# Patient Record
Sex: Female | Born: 1940 | Race: White | Hispanic: No | Marital: Married | Smoking: Never smoker
Health system: Southern US, Community
[De-identification: ages and names within clinical notes are randomized; demographics above are authoritative.]

## PROBLEM LIST (undated history)

## (undated) DIAGNOSIS — L700 Acne vulgaris: Secondary | ICD-10-CM

## (undated) DIAGNOSIS — F40240 Claustrophobia: Secondary | ICD-10-CM

## (undated) DIAGNOSIS — E785 Hyperlipidemia, unspecified: Secondary | ICD-10-CM

## (undated) DIAGNOSIS — G2581 Restless legs syndrome: Secondary | ICD-10-CM

## (undated) DIAGNOSIS — K635 Polyp of colon: Secondary | ICD-10-CM

## (undated) DIAGNOSIS — E049 Nontoxic goiter, unspecified: Secondary | ICD-10-CM

## (undated) DIAGNOSIS — F988 Other specified behavioral and emotional disorders with onset usually occurring in childhood and adolescence: Secondary | ICD-10-CM

## (undated) DIAGNOSIS — K5790 Diverticulosis of intestine, part unspecified, without perforation or abscess without bleeding: Secondary | ICD-10-CM

## (undated) DIAGNOSIS — J309 Allergic rhinitis, unspecified: Secondary | ICD-10-CM

## (undated) DIAGNOSIS — F419 Anxiety disorder, unspecified: Secondary | ICD-10-CM

## (undated) DIAGNOSIS — N939 Abnormal uterine and vaginal bleeding, unspecified: Secondary | ICD-10-CM

## (undated) DIAGNOSIS — M722 Plantar fascial fibromatosis: Secondary | ICD-10-CM

## (undated) DIAGNOSIS — R42 Dizziness and giddiness: Secondary | ICD-10-CM

## (undated) DIAGNOSIS — E669 Obesity, unspecified: Secondary | ICD-10-CM

## (undated) DIAGNOSIS — E039 Hypothyroidism, unspecified: Secondary | ICD-10-CM

## (undated) DIAGNOSIS — F32A Depression, unspecified: Secondary | ICD-10-CM

## (undated) DIAGNOSIS — G20 Parkinson's disease: Secondary | ICD-10-CM

## (undated) DIAGNOSIS — R319 Hematuria, unspecified: Secondary | ICD-10-CM

## (undated) DIAGNOSIS — K589 Irritable bowel syndrome without diarrhea: Secondary | ICD-10-CM

## (undated) DIAGNOSIS — F329 Major depressive disorder, single episode, unspecified: Secondary | ICD-10-CM

## (undated) DIAGNOSIS — M152 Bouchard's nodes (with arthropathy): Secondary | ICD-10-CM

## (undated) DIAGNOSIS — G20A1 Parkinson's disease without dyskinesia, without mention of fluctuations: Secondary | ICD-10-CM

## (undated) HISTORY — DX: Plantar fascial fibromatosis: M72.2

## (undated) HISTORY — DX: Major depressive disorder, single episode, unspecified: F32.9

## (undated) HISTORY — DX: Obesity, unspecified: E66.9

## (undated) HISTORY — DX: Parkinson's disease without dyskinesia, without mention of fluctuations: G20.A1

## (undated) HISTORY — PX: SHOULDER SURGERY: SHX246

## (undated) HISTORY — DX: Bouchard's nodes (with arthropathy): M15.2

## (undated) HISTORY — DX: Acne vulgaris: L70.0

## (undated) HISTORY — DX: Hyperlipidemia, unspecified: E78.5

## (undated) HISTORY — DX: Other specified behavioral and emotional disorders with onset usually occurring in childhood and adolescence: F98.8

## (undated) HISTORY — DX: Nontoxic goiter, unspecified: E04.9

## (undated) HISTORY — DX: Abnormal uterine and vaginal bleeding, unspecified: N93.9

## (undated) HISTORY — DX: Irritable bowel syndrome, unspecified: K58.9

## (undated) HISTORY — DX: Parkinson's disease: G20

## (undated) HISTORY — DX: Restless legs syndrome: G25.81

## (undated) HISTORY — DX: Polyp of colon: K63.5

## (undated) HISTORY — DX: Diverticulosis of intestine, part unspecified, without perforation or abscess without bleeding: K57.90

## (undated) HISTORY — DX: Claustrophobia: F40.240

## (undated) HISTORY — DX: Dizziness and giddiness: R42

## (undated) HISTORY — DX: Anxiety disorder, unspecified: F41.9

## (undated) HISTORY — DX: Hypothyroidism, unspecified: E03.9

## (undated) HISTORY — PX: GLAUCOMA SURGERY: SHX656

## (undated) HISTORY — DX: Allergic rhinitis, unspecified: J30.9

## (undated) HISTORY — DX: Hematuria, unspecified: R31.9

## (undated) HISTORY — DX: Depression, unspecified: F32.A

---

## 1982-12-12 HISTORY — PX: SIMPLE MASTECTOMY: SHX2409

## 1992-12-12 DIAGNOSIS — R319 Hematuria, unspecified: Secondary | ICD-10-CM

## 1992-12-12 HISTORY — DX: Hematuria, unspecified: R31.9

## 1998-08-18 ENCOUNTER — Other Ambulatory Visit: Admission: RE | Admit: 1998-08-18 | Discharge: 1998-08-18 | Payer: Self-pay | Admitting: *Deleted

## 1999-09-15 ENCOUNTER — Other Ambulatory Visit: Admission: RE | Admit: 1999-09-15 | Discharge: 1999-09-15 | Payer: Self-pay | Admitting: *Deleted

## 2000-07-20 ENCOUNTER — Encounter: Payer: Self-pay | Admitting: Orthopedic Surgery

## 2000-07-20 ENCOUNTER — Encounter: Admission: RE | Admit: 2000-07-20 | Discharge: 2000-07-20 | Payer: Self-pay | Admitting: Orthopedic Surgery

## 2000-09-15 ENCOUNTER — Other Ambulatory Visit: Admission: RE | Admit: 2000-09-15 | Discharge: 2000-09-15 | Payer: Self-pay | Admitting: *Deleted

## 2000-12-21 ENCOUNTER — Other Ambulatory Visit: Admission: RE | Admit: 2000-12-21 | Discharge: 2000-12-21 | Payer: Self-pay | Admitting: *Deleted

## 2001-09-17 ENCOUNTER — Other Ambulatory Visit: Admission: RE | Admit: 2001-09-17 | Discharge: 2001-09-17 | Payer: Self-pay | Admitting: *Deleted

## 2002-09-09 ENCOUNTER — Other Ambulatory Visit: Admission: RE | Admit: 2002-09-09 | Discharge: 2002-09-09 | Payer: Self-pay | Admitting: *Deleted

## 2003-09-11 ENCOUNTER — Other Ambulatory Visit: Admission: RE | Admit: 2003-09-11 | Discharge: 2003-09-11 | Payer: Self-pay | Admitting: Obstetrics and Gynecology

## 2004-09-13 ENCOUNTER — Other Ambulatory Visit: Admission: RE | Admit: 2004-09-13 | Discharge: 2004-09-13 | Payer: Self-pay | Admitting: *Deleted

## 2006-09-18 ENCOUNTER — Other Ambulatory Visit: Admission: RE | Admit: 2006-09-18 | Discharge: 2006-09-18 | Payer: Self-pay | Admitting: Obstetrics & Gynecology

## 2007-03-06 ENCOUNTER — Ambulatory Visit: Payer: Self-pay | Admitting: Internal Medicine

## 2007-03-20 ENCOUNTER — Ambulatory Visit: Payer: Self-pay | Admitting: Internal Medicine

## 2007-05-13 HISTORY — PX: OTHER SURGICAL HISTORY: SHX169

## 2007-09-26 ENCOUNTER — Other Ambulatory Visit: Admission: RE | Admit: 2007-09-26 | Discharge: 2007-09-26 | Payer: Self-pay | Admitting: Obstetrics & Gynecology

## 2007-12-27 ENCOUNTER — Ambulatory Visit: Payer: Self-pay | Admitting: Internal Medicine

## 2007-12-27 LAB — CONVERTED CEMR LAB
ALT: 12 units/L (ref 0–35)
Albumin: 4 g/dL (ref 3.5–5.2)
Alkaline Phosphatase: 54 units/L (ref 39–117)
Creatinine, Ser: 0.7 mg/dL (ref 0.4–1.2)

## 2008-01-02 ENCOUNTER — Ambulatory Visit (HOSPITAL_COMMUNITY): Admission: RE | Admit: 2008-01-02 | Discharge: 2008-01-02 | Payer: Self-pay | Admitting: Internal Medicine

## 2008-10-06 ENCOUNTER — Other Ambulatory Visit: Admission: RE | Admit: 2008-10-06 | Discharge: 2008-10-06 | Payer: Self-pay | Admitting: Obstetrics & Gynecology

## 2008-10-30 ENCOUNTER — Encounter: Admission: RE | Admit: 2008-10-30 | Discharge: 2008-10-30 | Payer: Self-pay | Admitting: Sports Medicine

## 2008-11-25 ENCOUNTER — Encounter: Admission: RE | Admit: 2008-11-25 | Discharge: 2008-11-25 | Payer: Self-pay | Admitting: Sports Medicine

## 2008-12-10 ENCOUNTER — Encounter: Admission: RE | Admit: 2008-12-10 | Discharge: 2008-12-10 | Payer: Self-pay | Admitting: Sports Medicine

## 2009-01-16 ENCOUNTER — Encounter: Admission: RE | Admit: 2009-01-16 | Discharge: 2009-01-16 | Payer: Self-pay | Admitting: Rheumatology

## 2009-04-13 ENCOUNTER — Encounter: Admission: RE | Admit: 2009-04-13 | Discharge: 2009-04-13 | Payer: Self-pay | Admitting: Sports Medicine

## 2009-07-13 ENCOUNTER — Encounter: Admission: RE | Admit: 2009-07-13 | Discharge: 2009-07-13 | Payer: Self-pay | Admitting: Orthopedic Surgery

## 2011-01-02 ENCOUNTER — Encounter: Payer: Self-pay | Admitting: Neurological Surgery

## 2011-04-26 NOTE — Assessment & Plan Note (Signed)
[facility]                         GASTROENTEROLOGY OFFICE NOTE   MARVIA, TROOST                      MRN:          [id number]  DATE:12/27/2007                            DOB:          1941-04-26    Ms. Cassarino is a 70 year old white female who is here today for  evaluation of pain under her right breast, radiating laterally all the  way to the back which has been there for at least 6 months.  The date of  onset corresponds with a breast implant replacement in June 2008 by Dr.  Stephens November.  She has slightly larger implants this time than before.  The pain is worse during the day, as the day progresses, and usually is  relieved by laying down; it also gets worse after she eats.  She has  gained all together about 10 pounds in the last year.  The pain is not  associated with exercise, in fact she has a Systems analyst 4 times a  week and does weight lifting and aerobics without any influence from her  right-sided pain.  It is a burning pain and becomes quite severe at  times.  There is no rash overlying the pain.  Pain is not worse with  inspiration and there are no respiratory symptoms of shortness of breath  or cough.  She has never had this pain prior to the surgery.  We have  seen Ms. Shuler in the past for colorectal screening, she had a  colonoscopy in 1997 and again in 2003 and last time in April 2008.  She  has a history of hyperplastic polyp of the colon and first grade  hemorrhoids, these do not seem to be issues at this time.   MEDICATIONS:  1. Lexapro 20 mg p.o. daily.  2. Ambien 5 mg nightly.  3. Synthroid 50 mcg daily.  4. Vivelle patch 0.0375 mg b.i.d.  5. Prometrium 100 mg daily.  6. Aspirin 81 mg p.o. daily.  7. Fish oil.  8. Red yeast rice.  9. Vitamin C, B.  10.Calcium supplement.  11.Magnesium.  12.Stool softener.   PAST HISTORY:  1. Thyroid problems.  2. Anxiety.  3. Allergies.  4. Breast surgeries x2.   She  had initially subtotal mastectomy in 1982 at [facility] because of high  risk for breast cancer.  She had silicone implants initially and these  were replaced by new silicone implants in June 2008.   FAMILY HISTORY:  Positive for diabetes in mother, breast cancer in  mother, grandmothers and aunts.   SOCIAL HISTORY:  Married with no children.  She has Master's Degree in  Education, she is a Runner, broadcasting/film/video.  She does not smoke, drinks alcohol  socially.   REVIEW OF SYSTEMS:  Positive for allergies, sleeping problems.   PHYSICAL EXAMINATION:  Blood pressure 110/70, pulse 64, weight 174  pounds.  She was alert, oriented, in no distress.  NECK:  Supple, there was no tenderness.  No tenderness over the scapula.  There was some tenderness over the mid-  thoracic spine extending through the intercostal space all the way  around to  the right upper quadrant around the 10th or 11th ribs.  The  breast implants were well-healed and I could not elicit any tenderness  as far as the breast is concerned.  There is no lymphadenopathy in the  right axilla.  On inspiration her lungs were clear, there was no pain or  rub on inspiration.  ABDOMEN:  Soft and nontender with normoactive bowel sounds, no  tenderness on inspiration.  Liver edge was at costal margin.  SKIN:  Did not show any herpetic rash overlying the area of the pain.   IMPRESSION:  A 70 year old white female with new onset of right side  lateral chest pain which resembles a chest wall pain except that it is  not related to exercise.  One would think that it has some correlation  with her breast implants such as since the pain started after her  surgery and they are a larger size than before, but the pain radiates  all the way to the back, to the thoracic spine, which raises the  question either of shingles or possibly some sort of intercostal  neuralgia.  Patient is suspecting this could be her gallbladder as well,  although she really does not have  any clear cut gastrointestinal  symptoms and her exam of the right upper quadrant is negative.   PLAN:  1. CT scan of the chest to look for any pulmonary lesions, any chest      wall abnormalities or structure abnormalities of the right      diaphragm.  2. Upper abdominal ultrasound to look at the liver as well as      gallbladder.  3. Consider a trial of an intercostal nerve block to see whether it      would relieve her pain.  4. We are also going to check her liver function test today.     Hedwig Morton. Juanda Chance, MD  Electronically Signed    DMB/MedQ  DD: 12/27/2007  DT: 12/27/2007  Job #: [id number]   cc:   Consuello Bossier., M.D.

## 2011-12-22 ENCOUNTER — Other Ambulatory Visit: Payer: Self-pay | Admitting: Orthopedic Surgery

## 2011-12-22 DIAGNOSIS — M542 Cervicalgia: Secondary | ICD-10-CM

## 2011-12-25 ENCOUNTER — Ambulatory Visit
Admission: RE | Admit: 2011-12-25 | Discharge: 2011-12-25 | Disposition: A | Discharge status: Home / Self Care | Payer: BC Managed Care – PPO | Source: Ambulatory Visit | Attending: Orthopedic Surgery | Admitting: Orthopedic Surgery

## 2011-12-25 DIAGNOSIS — M542 Cervicalgia: Secondary | ICD-10-CM

## 2011-12-28 ENCOUNTER — Other Ambulatory Visit: Payer: Self-pay | Admitting: Orthopedic Surgery

## 2011-12-28 DIAGNOSIS — M79603 Pain in arm, unspecified: Secondary | ICD-10-CM

## 2011-12-28 DIAGNOSIS — M542 Cervicalgia: Secondary | ICD-10-CM

## 2011-12-29 ENCOUNTER — Ambulatory Visit
Admission: RE | Admit: 2011-12-29 | Discharge: 2011-12-29 | Disposition: A | Discharge status: Home / Self Care | Payer: Medicare Other | Source: Ambulatory Visit | Attending: Orthopedic Surgery | Admitting: Orthopedic Surgery

## 2011-12-29 DIAGNOSIS — M542 Cervicalgia: Secondary | ICD-10-CM

## 2011-12-29 DIAGNOSIS — M79603 Pain in arm, unspecified: Secondary | ICD-10-CM

## 2011-12-29 MED ORDER — TRIAMCINOLONE ACETONIDE 40 MG/ML IJ SUSP (RADIOLOGY)
60.0000 mg | Freq: Once | INTRAMUSCULAR | Status: AC
  Administered 2011-12-29: 60 mg via EPIDURAL

## 2011-12-29 MED ORDER — IOHEXOL 300 MG/ML  SOLN
1.0000 mL | Freq: Once | INTRAMUSCULAR | Status: AC | PRN
  Administered 2011-12-29: 1 mL via EPIDURAL

## 2012-05-03 ENCOUNTER — Encounter: Payer: Self-pay | Admitting: *Deleted

## 2012-05-03 ENCOUNTER — Telehealth: Payer: Self-pay | Admitting: Internal Medicine

## 2012-05-03 NOTE — Telephone Encounter (Signed)
Patient calling to report 4-5 bowel movements/day that are pudding like consistency for the last several months. Also has lots of gas. Denies pain, cramping, recent travel or antibiotic use. States it is now making her uncomfortable going out because she is afraid to get to far from a bathroom. She has tried Citrucel and Metamucil. She will add Align. Last colonoscopy 03/20/07- diverticulosis. Scheduled OV on 05/11/12 at 2:30 PM.

## 2012-05-04 MED ORDER — DICYCLOMINE HCL 20 MG PO TABS: 20.0000 mg | ORAL_TABLET | Freq: Three times a day (TID) | ORAL | Status: DC | PRN

## 2012-05-04 NOTE — Telephone Encounter (Signed)
Spoke with patient and gave her Dr. Brodie's recommendation. rx sent. 

## 2012-05-04 NOTE — Telephone Encounter (Signed)
Rx sent to pharmacy. Line busy unable to reach patient

## 2012-05-04 NOTE — Telephone Encounter (Signed)
May use bentyl 20 mg po tid till she sees me on 05/11/2012, #30

## 2012-05-11 ENCOUNTER — Ambulatory Visit (INDEPENDENT_AMBULATORY_CARE_PROVIDER_SITE_OTHER): Payer: BC Managed Care – PPO | Admitting: Internal Medicine

## 2012-05-11 ENCOUNTER — Encounter: Payer: Self-pay | Admitting: Internal Medicine

## 2012-05-11 VITALS — BP 104/72 | HR 68 | Ht 63.5 in | Wt 191.2 lb

## 2012-05-11 DIAGNOSIS — K589 Irritable bowel syndrome without diarrhea: Secondary | ICD-10-CM

## 2012-05-11 NOTE — Patient Instructions (Addendum)
We have given you samples of Align. This puts good bacteria back into your colon. You should take 1 capsule by mouth once daily. If this works well for you, it can be purchased over the counter. CC: Dr Kirby Funk Take bentyl 10 mg at  Bedtime to avoid the sleepiness associated with the medication

## 2012-05-11 NOTE — Progress Notes (Signed)
Ashley Henderson May 24, 1941 MRN [id number]    History of Present Illness:  This is a 71 year old white female with irritable bowel syndrome and a recent change in bowel habits  up to 4 soft bowl movements  times a day. She went to the beach recently and could not get away from the house because of frequent stools. She has had a stressful year. Her father died and her husband's son passed away . She denies any abdominal pain or rectal bleeding. She has had several colonoscopies in the past; the first one was in 1997, then in 2003, she had a hyperplastic polyp and in April 2008, no polyps were found. She was recently given probiotics which greatly improved her bowel habits to were she doesn't go as often. We sent her in some Bentyl 10 mg but it made her sleepy.   Past Medical History  Diagnosis Date  . Hemorrhoids   . Hyperplastic colon polyp   . Diverticulosis   . Anxiety   . Depression   . Glaucoma   . IBS (irritable bowel syndrome)   . Hypothyroidism    Past Surgical History  Procedure Date  . Simple mastectomy     bilateral  . Tongue cystectomy   . Hemorrhoid surgery     clots removed  . Glaucoma surgery     bilateral laser  . Shoulder surgery     bilateral    reports that she has never smoked. She has never used smokeless tobacco. She reports that she drinks alcohol. She reports that she does not use illicit drugs. family history includes Bone cancer in her mother; Breast cancer in her maternal aunt, maternal grandmother, mother, and paternal aunt; Cancer in her paternal uncle; Diabetes in her mother; and Heart attack in her father.  There is no history of Colon cancer. No Known Allergies      Review of Systems: Denies dysphagia, odynophagia, chest pain or shortness of breath  The remainder of the 10 point ROS is negative except as outlined in H&P   Physical Exam: General appearance  Well developed, in no distress. Eyes- non icteric. HEENT nontraumatic,  normocephalic. Mouth no lesions, tongue papillated, no cheilosis. Neck supple without adenopathy, thyroid not enlarged, no carotid bruits, no JVD. Lungs Clear to auscultation bilaterally. Cor normal S1, normal S2, regular rhythm, no murmur,  quiet precordium. Abdomen: Soft relaxed with normal active bowel sounds. No focal tenderness. No distention. Liver edge at costal margin.  Rectal: Soft Hemoccult negative stool. Extremities no pedal edema. Skin no lesions. Neurological alert and oriented x 3. Psychological normal mood and affect.  Assessment and Plan:  Problem #1 Change in bowel habits in a patient with irritable bowel syndrome. Stool frequencies consistent with increased colon spasm. I asked her to take the Bentyl at bedtime and continue on probiotics which seems to have helped overall. She is not due for a colonoscopy yet. There is no evidence of occult GI blood loss. She had a sprue profile in the past which was negative. We will see her on a when necessary basis. Problem #2 Hyperplastic polyp- recall colon not before 03/2014   05/11/2012 Lina Sar

## 2012-05-22 ENCOUNTER — Encounter: Payer: Self-pay | Admitting: Internal Medicine

## 2012-10-05 ENCOUNTER — Other Ambulatory Visit: Payer: Self-pay | Admitting: Neurological Surgery

## 2012-10-05 DIAGNOSIS — M5412 Radiculopathy, cervical region: Secondary | ICD-10-CM

## 2012-10-05 DIAGNOSIS — M47812 Spondylosis without myelopathy or radiculopathy, cervical region: Secondary | ICD-10-CM

## 2012-10-10 ENCOUNTER — Ambulatory Visit
Admission: RE | Admit: 2012-10-10 | Discharge: 2012-10-10 | Disposition: A | Discharge status: Home / Self Care | Payer: BC Managed Care – PPO | Source: Ambulatory Visit | Attending: Neurological Surgery | Admitting: Neurological Surgery

## 2012-10-10 DIAGNOSIS — M47812 Spondylosis without myelopathy or radiculopathy, cervical region: Secondary | ICD-10-CM

## 2012-10-10 DIAGNOSIS — M5412 Radiculopathy, cervical region: Secondary | ICD-10-CM

## 2012-10-10 MED ORDER — TRIAMCINOLONE ACETONIDE 40 MG/ML IJ SUSP (RADIOLOGY)
60.0000 mg | Freq: Once | INTRAMUSCULAR | Status: AC
  Administered 2012-10-10: 60 mg via EPIDURAL

## 2012-10-10 MED ORDER — IOHEXOL 300 MG/ML  SOLN
1.0000 mL | Freq: Once | INTRAMUSCULAR | Status: AC | PRN
  Administered 2012-10-10: 1 mL via EPIDURAL

## 2013-05-21 ENCOUNTER — Other Ambulatory Visit: Payer: Self-pay | Admitting: *Deleted

## 2013-05-21 MED ORDER — NYSTATIN-TRIAMCINOLONE 100000-0.1 UNIT/GM-% EX OINT: TOPICAL_OINTMENT | Freq: Two times a day (BID) | CUTANEOUS | Status: DC

## 2013-05-21 NOTE — Telephone Encounter (Signed)
Faxed refill request received from pharmacy for Nystatin cream Last filled by MD on 01/04/13, 30g x 0 rf Last AEX - 01/04/13 Next AEX - 03/28/14 Please advise refills.  Chart in your door. Thanks.

## 2013-06-21 ENCOUNTER — Telehealth: Payer: Self-pay | Admitting: *Deleted

## 2013-06-21 ENCOUNTER — Other Ambulatory Visit: Payer: Self-pay | Admitting: *Deleted

## 2013-06-21 NOTE — Telephone Encounter (Signed)
Pt is requesting refill on Escitalopram 20 mg #30/0 refills via fax. Please advise. Chart on your door.

## 2013-06-21 NOTE — Telephone Encounter (Signed)
Please have patient come in for a talking visit.  She has been off Lexapro at least 6 months, if not more.  I want to be sure her issues are being addressed properly.

## 2013-06-21 NOTE — Telephone Encounter (Signed)
Pt has a medication consult with you (Dr. Edward Jolly) on 07/01/2013 at 12:45.

## 2013-07-01 ENCOUNTER — Encounter: Payer: Self-pay | Admitting: Obstetrics and Gynecology

## 2013-07-01 ENCOUNTER — Ambulatory Visit (INDEPENDENT_AMBULATORY_CARE_PROVIDER_SITE_OTHER): Payer: BC Managed Care – HMO | Admitting: Obstetrics and Gynecology

## 2013-07-01 VITALS — BP 130/70 | HR 60 | Ht 63.0 in | Wt 184.0 lb

## 2013-07-01 DIAGNOSIS — F329 Major depressive disorder, single episode, unspecified: Secondary | ICD-10-CM

## 2013-07-01 MED ORDER — ESCITALOPRAM OXALATE 20 MG PO TABS: 20.0000 mg | ORAL_TABLET | Freq: Every day | ORAL | Status: DC

## 2013-07-01 NOTE — Progress Notes (Signed)
Patient ID: Ashley Henderson, female   DOB: 01/17/1941, 72 y.o.   MRN: [id number]  72 y.o.   Married    Caucasian   female    Here for medication refill.  Patient not on Lexapro for one year.   Restarted an old bottle on her own.  Back on for three weeks. Was losing interest in activity.  Already starting to feel better. Some side effects of sleepiness so takes medication before bed. Hot flashes improved.   Eating OK. Feels relationship improved.  Feels sad about lack of sexual activity with her husband. Not suicidal.  Has clostrophobia.  Fear of flying. Has been in counseling in past with Dr. Lynnette Caffey.   No LMP recorded. Patient is postmenopausal.            Family History  Problem Relation Age of Onset  . Breast cancer Mother   . Bone cancer Mother   . Colon cancer Neg Hx   . Breast cancer Maternal Grandmother   . Breast cancer Maternal Aunt   . Diabetes Mother   . Breast cancer Paternal Aunt   . Cancer Paternal Uncle     ?, x 2 uncles  . Heart attack Father     There are no active problems to display for this patient.   Past Medical History  Diagnosis Date  . Hemorrhoids   . Hyperplastic colon polyp   . Diverticulosis   . Anxiety   . Depression   . Glaucoma   . IBS (irritable bowel syndrome)   . Hypothyroidism     Past Surgical History  Procedure Laterality Date  . Simple mastectomy      bilateral  . Tongue cystectomy    . Hemorrhoid surgery      clots removed  . Glaucoma surgery      bilateral laser  . Shoulder surgery      bilateral    Allergies: Sulfa antibiotics  Current Outpatient Prescriptions  Medication Sig Dispense Refill  . aspirin 81 MG tablet Take 81 mg by mouth daily.      . B Complex Vitamins (VITAMIN B COMPLEX PO) Take 1 tablet by mouth daily.      . Calcium Carbonate-Vitamin D (CALCIUM-D) 600-400 MG-UNIT TABS Take 1 tablet by mouth 2 (two) times daily.      . Cyanocobalamin (VITAMIN B 12) 100 MCG LOZG Take by mouth.      .  escitalopram (LEXAPRO) 20 MG tablet Take 20 mg by mouth daily.      . fluticasone (FLONASE) 50 MCG/ACT nasal spray Place 2 sprays into the nose as needed.      Marland Kitchen levothyroxine (SYNTHROID, LEVOTHROID) 75 MCG tablet Take 75 mcg by mouth daily.      Marland Kitchen nystatin-triamcinolone ointment (MYCOLOG) Apply topically 2 (two) times daily.  30 g  0  . Omega-3 Fatty Acids (FISH OIL) 1000 MG CAPS Take 1 capsule by mouth daily.      . Probiotic Product (ALIGN) 4 MG CAPS Take 1 tablet by mouth daily.      . vitamin B-12 (CYANOCOBALAMIN) 1000 MCG tablet Take 1,000 mcg by mouth daily.      . Red Yeast Rice 600 MG TABS Take 1 tablet by mouth daily.      . traMADol (ULTRAM) 50 MG tablet Take 50 mg by mouth every 6 (six) hours as needed.      . zolpidem (AMBIEN) 10 MG tablet Take 10 mg by mouth as needed.  No current facility-administered medications for this visit.    ROS: Pertinent items are noted in HPI.  Social Hx:  Enjoys Museum/gallery conservator.  Wants to travel down the [place] on a river boat.  Exam:    BP 130/70  Pulse 60  Ht 5\' 3"  (1.6 m)  Wt 184 lb (83.462 kg)  BMI 32.6 kg/m2   Wt Readings from Last 3 Encounters:  07/01/13 184 lb (83.462 kg)  05/11/12 191 lb 4 oz (86.75 kg)  12/29/11 187 lb (84.823 kg)     Ht Readings from Last 3 Encounters:  07/01/13 5\' 3"  (1.6 m)  05/11/12 5' 3.5" (1.613 m)  12/29/11 5\' 3"  (1.6 m)    General appearance: alert, cooperative and appears stated age Neurologic: Grossly normal.   Assessment  Depression, now improved back on usual past dosage of Lexapro.  Plan  Rx for Lexapro 20 mg daily. I recommend return to counseling with therapist.  Patient agrees. Follow up for annual exam in 6 months or prn.   An After Visit Summary was printed and given to the patient.

## 2013-08-20 ENCOUNTER — Telehealth: Payer: Self-pay | Admitting: Internal Medicine

## 2013-08-20 ENCOUNTER — Ambulatory Visit (INDEPENDENT_AMBULATORY_CARE_PROVIDER_SITE_OTHER): Payer: Medicare Other | Admitting: Nurse Practitioner

## 2013-08-20 ENCOUNTER — Encounter: Payer: Self-pay | Admitting: Nurse Practitioner

## 2013-08-20 VITALS — BP 120/70 | HR 66 | Ht 63.5 in | Wt 182.0 lb

## 2013-08-20 DIAGNOSIS — K589 Irritable bowel syndrome without diarrhea: Secondary | ICD-10-CM

## 2013-08-20 DIAGNOSIS — K625 Hemorrhage of anus and rectum: Secondary | ICD-10-CM | POA: Insufficient documentation

## 2013-08-20 MED ORDER — DICYCLOMINE HCL 10 MG PO CAPS: 10.0000 mg | ORAL_CAPSULE | Freq: Two times a day (BID) | ORAL | Status: DC | PRN

## 2013-08-20 MED ORDER — HYDROCORTISONE ACETATE 25 MG RE SUPP: 25.0000 mg | Freq: Every day | RECTAL | Status: DC

## 2013-08-20 NOTE — Telephone Encounter (Signed)
Spoke with patient and she woke up at 3 AM with stomach cramping, diarrhea and nausea. No vomiting. This AM she thought she felt better but then she had diarrhea with bright, red blood in it. Some cramping now. States she did eat a lot of chicken salad last night. She is suppose to go out of town Friday and would like to feel better. Scheduled with Willette Cluster, NP today at 1:30 PM.

## 2013-08-20 NOTE — Progress Notes (Signed)
  History of Present Illness:   Patient is a 72 year old female known to Dr. Juanda Chance for history of IBS. She was last seen May 2013 after presenting with increased frequency of stools. Patient has been on probiotics since before he last visit and overall doing okay until recently. Now having occasional episodes of diarrhea and rectal bleeding. Her lower abdominal cramps are occuring more often. She describes herself as uptight but not under any unusual stress.   At 3am patient woke up with crampy diarrhea which contained some blood. She was nauseated but attributes that to pain. She had another episode of diarrhea with blood around 9:30am. Patient gives a history of occasional hemorrhoidal bleeding but feels bleeding this am was atypical. No sick contacts.    Current Medications, Allergies, Past Medical History, Past Surgical History, Family History and Social History were reviewed in [employer] record.  Physical Exam: General: Well developed , white female in no acute distress Head: Normocephalic and atraumatic Eyes:  sclerae anicteric, conjunctiva pink  Ears: Normal auditory acuity Lungs: Clear throughout to auscultation Heart: Regular rate and rhythm Abdomen: Soft, non distended, non-tender. No masses, no hepatomegaly. Normal bowel sounds Rectal: No external lesions, + internal hemorrhoids on anoscopy Musculoskeletal: Symmetrical with no gross deformities  Extremities: No edema  Neurological: Alert oriented x 4, grossly nonfocal Psychological:  Alert and cooperative. Normal mood and affect  Assessment and Recommendations:  62. 72 year old female with longstanding diarrhea predominant IBS, doing fairly well on probiotics. Still having occasional diarrhea but abdominal cramps occuring more frequently and she has intermittent rectal bleeding as well. Patient due for surveillance colonoscopy this coming April. Suspect bleeding secondary to hemorrhoids but in light of  bleeding it is not unreasonable to proceed with colonoscopy at an earlier time. The risks, benefits, and alternatives to colonoscopy with possible biopsy and possible polypectomy were discussed with the patient and she consents to proceed. Her last office note patient was tried on dicyclomine but it made her sleepy. We discussed a bowel antispasmodic, patient does not recall ever taking one. Will try Bentyl again and see how she does.  2.Acute nausea, abdominal cramps, diarrhea with blood. This may be exacerbation of IBS. Infectious and/or ischemia not excluded. Patient looks okay, abdominal exam is not overly concerning.

## 2013-08-20 NOTE — Patient Instructions (Addendum)

## 2013-08-21 ENCOUNTER — Encounter: Payer: Self-pay | Admitting: Internal Medicine

## 2013-08-21 ENCOUNTER — Encounter: Payer: Self-pay | Admitting: Nurse Practitioner

## 2013-08-21 NOTE — Progress Notes (Signed)
Reviewed and agree. Long standing IBS

## 2013-09-06 ENCOUNTER — Encounter: Payer: Self-pay | Admitting: Obstetrics & Gynecology

## 2013-09-11 ENCOUNTER — Encounter: Payer: Self-pay | Admitting: Internal Medicine

## 2013-09-11 ENCOUNTER — Ambulatory Visit (AMBULATORY_SURGERY_CENTER): Payer: BC Managed Care – PPO | Admitting: Internal Medicine

## 2013-09-11 VITALS — BP 125/60 | HR 56 | Temp 98.6°F | Resp 16 | Ht 63.5 in | Wt 182.0 lb

## 2013-09-11 DIAGNOSIS — D126 Benign neoplasm of colon, unspecified: Secondary | ICD-10-CM

## 2013-09-11 DIAGNOSIS — R197 Diarrhea, unspecified: Secondary | ICD-10-CM

## 2013-09-11 DIAGNOSIS — K625 Hemorrhage of anus and rectum: Secondary | ICD-10-CM

## 2013-09-11 MED ORDER — SODIUM CHLORIDE 0.9 % IV SOLN: 500.0000 mL | INTRAVENOUS | Status: DC

## 2013-09-11 MED ORDER — HYDROCORTISONE ACETATE 25 MG RE SUPP: 25.0000 mg | Freq: Every evening | RECTAL | Status: DC | PRN

## 2013-09-11 NOTE — Progress Notes (Signed)
Called to room to assist during endoscopic procedure.  Patient ID and intended procedure confirmed with present staff. Received instructions for my participation in the procedure from the performing physician.  

## 2013-09-11 NOTE — Progress Notes (Signed)
Patient did not experience any of the following events: a burn prior to discharge; a fall within the facility; wrong site/side/patient/procedure/implant event; or a hospital transfer or hospital admission upon discharge from the facility. (G8907) Patient did not have preoperative order for IV antibiotic SSI prophylaxis. (G8918)  

## 2013-09-11 NOTE — Patient Instructions (Signed)
YOU HAD AN ENDOSCOPIC PROCEDURE TODAY AT THE [facility]: Refer to the procedure report that was given to you for any specific questions about what was found during the examination.  If the procedure report does not answer your questions, please call your gastroenterologist to clarify.  If you requested that your care partner not be given the details of your procedure findings, then the procedure report has been included in a sealed envelope for you to review at your convenience later.  YOU SHOULD EXPECT: Some feelings of bloating in the abdomen. Passage of more gas than usual.  Walking can help get rid of the air that was put into your GI tract during the procedure and reduce the bloating. If you had a lower endoscopy (such as a colonoscopy or flexible sigmoidoscopy) you may notice spotting of blood in your stool or on the toilet paper. If you underwent a bowel prep for your procedure, then you may not have a normal bowel movement for a few days.  DIET: Your first meal following the procedure should be a light meal and then it is ok to progress to your normal diet.  A half-sandwich or bowl of soup is an example of a good first meal.  Heavy or fried foods are harder to digest and may make you feel nauseous or bloated.  Likewise meals heavy in dairy and vegetables can cause extra gas to form and this can also increase the bloating.  Drink plenty of fluids but you should avoid alcoholic beverages for 24 hours.  ACTIVITY: Your care partner should take you home directly after the procedure.  You should plan to take it easy, moving slowly for the rest of the day.  You can resume normal activity the day after the procedure however you should NOT DRIVE or use heavy machinery for 24 hours (because of the sedation medicines used during the test).    SYMPTOMS TO REPORT IMMEDIATELY: A gastroenterologist can be reached at any hour.  During normal business hours, 8:30 AM to 5:00 PM Monday through Friday,  call [phone].  After hours and on weekends, please call the GI answering service at [phone] who will take a message and have the physician on call contact you.   Following lower endoscopy (colonoscopy or flexible sigmoidoscopy):  Excessive amounts of blood in the stool  Significant tenderness or worsening of abdominal pains  Swelling of the abdomen that is new, acute  Fever of 100F or higher    FOLLOW UP: If any biopsies were taken you will be contacted by phone or by letter within the next 1-3 weeks.  Call your gastroenterologist if you have not heard about the biopsies in 3 weeks.  Our staff will call the home number listed on your records the next business day following your procedure to check on you and address any questions or concerns that you may have at that time regarding the information given to you following your procedure. This is a courtesy call and so if there is no answer at the home number and we have not heard from you through the emergency physician on call, we will assume that you have returned to your regular daily activities without incident.  SIGNATURES/CONFIDENTIALITY: You and/or your care partner have signed paperwork which will be entered into your electronic medical record.  These signatures attest to the fact that that the information above on your After Visit Summary has been reviewed and is understood.  Full responsibility of the confidentiality   of this discharge information lies with you and/or your care-partner.     

## 2013-09-11 NOTE — Op Note (Signed)
[facility] [address] [place], [address]   COLONOSCOPY PROCEDURE REPORT  PATIENT: Ashley Henderson, Ashley Henderson  MR#: [id number] BIRTHDATE: 11/12/1941 , 71  yrs. old GENDER: Female ENDOSCOPIST: Hart Carwin, MD REFERRED BY:  Kirby Funk, M.D. PROCEDURE DATE:  09/11/2013 PROCEDURE:   Colonoscopy with biopsy ASA CLASS:   Class II INDICATIONS:hematochezia and 2003 colonoscopy- hyperplastic polyp, 2008 colon no polyp, now having diarrhea. MEDICATIONS: MAC sedation, administered by CRNA and propofol (Diprivan) 200mg  IV  DESCRIPTION OF PROCEDURE:   After the risks and benefits and of the procedure were explained, informed consent was obtained.  A digital rectal exam revealed no abnormalities of the rectum.    The LB [id number]  endoscope was introduced through the anus and advanced to the cecum, which was identified by both the appendix and ileocecal valve .  The quality of the prep was good, using MoviPrep .  The instrument was then slowly withdrawn as the colon was fully examined.     COLON FINDINGS: Small internal hemorrhoids were found., no evidence of bleeding, normal appearing mucosa, random biopsies to r/o micrscopic colitis     Retroflexed views revealed no abnormalities. The scope was then withdrawn from the patient and the procedure completed.  COMPLICATIONS: There were no complications. ENDOSCOPIC IMPRESSION: Small internal hemorrhoids s/p random colon biopsies to r/o microscopic colitis  RECOMMENDATIONS: rectal bleeding likely hemorrhoidal, will start Anusol HC supp, 1 hs  REPEAT EXAM: for Colonoscopy, pending biopsy results.  cc:  _______________________________ eSignedHart Carwin, MD 09/11/2013 4:28 PM

## 2013-09-12 ENCOUNTER — Telehealth: Payer: Self-pay | Admitting: *Deleted

## 2013-09-12 NOTE — Telephone Encounter (Signed)
  Follow up Call-  Call back number 09/11/2013  Post procedure Call Back phone  # [phone]  Permission to leave phone message Yes     Patient questions:  Do you have a fever, pain , or abdominal swelling? no Pain Score  0 *  Have you tolerated food without any problems? yes  Have you been able to return to your normal activities? yes  Do you have any questions about your discharge instructions: Diet   no Medications  no Follow up visit  no  Do you have questions or concerns about your Care? no  Actions: * If pain score is 4 or above: No action needed, pain <4.

## 2013-09-17 ENCOUNTER — Encounter: Payer: Self-pay | Admitting: Internal Medicine

## 2014-01-27 ENCOUNTER — Telehealth: Payer: Self-pay | Admitting: *Deleted

## 2014-01-27 MED ORDER — NYSTATIN-TRIAMCINOLONE 100000-0.1 UNIT/GM-% EX OINT: 1.0000 "application " | TOPICAL_OINTMENT | Freq: Two times a day (BID) | CUTANEOUS | Status: DC

## 2014-01-27 NOTE — Telephone Encounter (Signed)
Yes.  OK to RF.  Thank you.

## 2014-01-27 NOTE — Telephone Encounter (Signed)
Nystatin-Triamcinolo (Mycolog II) given 01/04/13 apply to area bid x 7 days Disp 1 tube x 0 refills Annual Exam 03/26/14 ok to refill?

## 2014-02-14 ENCOUNTER — Encounter: Payer: Self-pay | Admitting: Internal Medicine

## 2014-03-26 ENCOUNTER — Ambulatory Visit: Payer: Self-pay | Admitting: Obstetrics & Gynecology

## 2014-03-28 ENCOUNTER — Encounter: Payer: Self-pay | Admitting: Obstetrics & Gynecology

## 2014-03-28 ENCOUNTER — Ambulatory Visit: Payer: Self-pay | Admitting: Obstetrics & Gynecology

## 2014-04-11 ENCOUNTER — Ambulatory Visit: Payer: Self-pay | Admitting: Obstetrics & Gynecology

## 2014-04-25 ENCOUNTER — Encounter: Payer: Self-pay | Admitting: Obstetrics & Gynecology

## 2014-04-25 ENCOUNTER — Ambulatory Visit (INDEPENDENT_AMBULATORY_CARE_PROVIDER_SITE_OTHER): Payer: Medicare Other | Admitting: Obstetrics & Gynecology

## 2014-04-25 VITALS — BP 114/62 | HR 60 | Resp 16 | Ht 63.25 in | Wt 188.4 lb

## 2014-04-25 DIAGNOSIS — Z124 Encounter for screening for malignant neoplasm of cervix: Secondary | ICD-10-CM

## 2014-04-25 DIAGNOSIS — Z0000 Encounter for general adult medical examination without abnormal findings: Secondary | ICD-10-CM

## 2014-04-25 DIAGNOSIS — Z01419 Encounter for gynecological examination (general) (routine) without abnormal findings: Secondary | ICD-10-CM

## 2014-04-25 LAB — POCT URINALYSIS DIPSTICK
BILIRUBIN UA: NEGATIVE
Glucose, UA: NEGATIVE
KETONES UA: NEGATIVE
Leukocytes, UA: NEGATIVE
Nitrite, UA: NEGATIVE
Protein, UA: NEGATIVE
Urobilinogen, UA: NEGATIVE
pH, UA: 5

## 2014-04-25 MED ORDER — ESCITALOPRAM OXALATE 20 MG PO TABS: 20.0000 mg | ORAL_TABLET | Freq: Every day | ORAL | Status: DC

## 2014-04-25 MED ORDER — CITALOPRAM HYDROBROMIDE 20 MG PO TABS: 20.0000 mg | ORAL_TABLET | Freq: Every day | ORAL | Status: DC

## 2014-04-25 NOTE — Addendum Note (Signed)
Addended by: Graylon Good on: 04/25/2014 12:24 PM   Modules accepted: Orders

## 2014-04-25 NOTE — Progress Notes (Signed)
73 y.o. G0P0000 MarriedCaucasianF here for annual exam.  No vaginal bleeding.  Doing well.    Patient's last menstrual period was 12/13/1995.          Sexually active: no  The current method of family planning is none.    Exercising: yes  trainer Smoker:  no  Health Maintenance: Pap:  11/23/10 WNL History of abnormal Pap:  no  MMG:  07/02/13 3D-normal Colonoscopy:  09/11/13 with Dr Olevia Perches BMD:   1/09-normal TDaP:  8/12 Screening Labs: PCP, Hb today: PCP, Urine today: RBC-trace   reports that she has never smoked. She has never used smokeless tobacco. She reports that she drinks about .5 ounces of alcohol per week. She reports that she does not use illicit drugs.  Past Medical History  Diagnosis Date  . Hemorrhoids   . Hyperplastic colon polyp   . Diverticulosis   . Anxiety   . Depression   . Glaucoma   . IBS (irritable bowel syndrome)     using align-saw Dr Olevia Perches in 2014  . Hypothyroidism   . Hematuria 1994    negative cysto, IVP  . Abnormal uterine bleeding (AUB)     endo biopsy negative  . Colon polyp   . Hyperlipidemia   . Plantar fasciitis   . ADD (attention deficit disorder)     Past Surgical History  Procedure Laterality Date  . Simple mastectomy      bilateral with implants  . Tongue cystectomy    . Hemorrhoid surgery      clots removed  . Glaucoma surgery      bilateral laser  . Shoulder surgery      bilateral  . Breast implants replaced  2006    Current Outpatient Prescriptions  Medication Sig Dispense Refill  . aspirin 81 MG tablet Take 81 mg by mouth daily.      . Cyanocobalamin (VITAMIN B 12 PO) Take by mouth daily.      Mariane Baumgarten Calcium (STOOL SOFTENER PO) Take by mouth.      . escitalopram (LEXAPRO) 20 MG tablet Take 1 tablet (20 mg total) by mouth daily.  30 tablet  11  . fluticasone (FLONASE) 50 MCG/ACT nasal spray Place 2 sprays into the nose as needed.      Marland Kitchen levothyroxine (SYNTHROID, LEVOTHROID) 75 MCG tablet Take 75 mcg by mouth daily.       Marland Kitchen nystatin-triamcinolone ointment (MYCOLOG) Apply 1 application topically 2 (two) times daily.  30 g  0  . Omega-3 Fatty Acids (FISH OIL) 1000 MG CAPS Take 1 capsule by mouth daily.      . Probiotic Product (ALIGN) 4 MG CAPS Take 1 tablet by mouth daily.      Marland Kitchen dicyclomine (BENTYL) 10 MG capsule Take 1 capsule (10 mg total) by mouth 2 (two) times daily between meals as needed.  60 capsule  0  . hydrocortisone (ANUSOL-HC) 25 MG suppository Place 1 suppository (25 mg total) rectally at bedtime as needed for hemorrhoids.  12 suppository  1   No current facility-administered medications for this visit.    Family History  Problem Relation Age of Onset  . Breast cancer Mother   . Bone cancer Mother   . Colon cancer Neg Hx   . Breast cancer Maternal Grandmother   . Breast cancer Maternal Aunt   . Diabetes Mother   . Breast cancer Paternal Aunt   . Cancer Paternal Uncle     ?, x 2 uncles  . Heart  attack Father     ROS:  Pertinent items are noted in HPI.  Otherwise, a comprehensive ROS was negative.  Exam:   BP 114/62  Pulse 60  Resp 16  Ht 5' 3.25" (1.607 m)  Wt 188 lb 6.4 oz (85.458 kg)  BMI 33.09 kg/m2  LMP 12/13/1995  Weight change: stable  Height: 5' 3.25" (160.7 cm)  Ht Readings from Last 3 Encounters:  04/25/14 5' 3.25" (1.607 m)  09/11/13 5' 3.5" (1.613 m)  08/20/13 5' 3.5" (1.613 m)    General appearance: alert, cooperative and appears stated age Head: Normocephalic, without obvious abnormality, atraumatic Neck: no adenopathy, supple, symmetrical, trachea midline and thyroid enlarged and nodular Lungs: clear to auscultation bilaterally Breasts: normal appearance, no masses or tenderness Heart: regular rate and rhythm Abdomen: soft, non-tender; bowel sounds normal; no masses,  no organomegaly Extremities: extremities normal, atraumatic, no cyanosis or edema Skin: Skin color, texture, turgor normal. No rashes or lesions Lymph nodes: Cervical, supraclavicular, and  axillary nodes normal. No abnormal inguinal nodes palpated Neurologic: Grossly normal   Pelvic: External genitalia:  no lesions              Urethra:  normal appearing urethra with no masses, tenderness or lesions              Bartholins and Skenes: normal                 Vagina: normal appearing vagina with normal color and discharge, no lesions              Cervix: no lesions              Pap taken: yes Bimanual Exam:  Uterus:  normal size, contour, position, consistency, mobility, non-tender              Adnexa: normal adnexa and no mass, fullness, tenderness               Rectovaginal: Confirms               Anus:  normal sphincter tone, no lesions  A:  Well Woman with normal exam H/O hypothyroidism Strong family hx of breast cancer, s/p simple mastectomy 1984, neg BRCA/BART testing  P:   Mammogram yearly. Doing 3D MMG. pap smear obtained today. Labs with PCP Rx of citalopram 20mg daily.  Rx to pharmacy.   return annually or prn  An After Visit Summary was printed and given to the patient.    

## 2014-04-28 LAB — IPS PAP SMEAR ONLY

## 2014-05-22 ENCOUNTER — Telehealth: Payer: Self-pay | Admitting: Obstetrics & Gynecology

## 2014-05-22 NOTE — Telephone Encounter (Signed)
Spoke with patient. Patient states that she was on Lexapro and it was causing her constipation so Dr.Miller switched her over to Celexa and told her to call back in three weeks with an update. Patient states that she has been taking the Celexa for three weeks and is still having constipation. " I do not feel better. I am still taking stool softener and fiber capsules two times per day. I am using the bathroom but it is only because I am taking the other medications to help." Patient states that [facility] had other options for the patient to try and she would like to try something new at this time. Advised patient would send a message over to [place] with an update and give patient a call back with further recommendations. Patient agreeable.

## 2014-05-22 NOTE — Telephone Encounter (Signed)
Patient is calling to give an update on a medication Dr. Sabra Heck prescribed.

## 2014-05-22 NOTE — Telephone Encounter (Signed)
Left message to call Samanta Gal at [phone]. 

## 2014-05-24 NOTE — Telephone Encounter (Signed)
Will need to try a medication that is not an SSRI (prozac, paxil, celexa, zoloft).  Needs to be counseled about options.  OV appropriate.  Please schedule.

## 2014-05-26 NOTE — Telephone Encounter (Signed)
Spoke with patient. Advised of message from [place]. Patient agreeable and verbalizes understanding. Offered June 22nd at 9:00am. Patient requests same day but later appointment. Appointment scheduled for June 22nd at 10:45am. Patient agreeable to date and time.  Routing to provider for final review. Patient agreeable to disposition. Will close encounter

## 2014-06-02 ENCOUNTER — Encounter: Payer: Self-pay | Admitting: Obstetrics & Gynecology

## 2014-06-02 ENCOUNTER — Ambulatory Visit (INDEPENDENT_AMBULATORY_CARE_PROVIDER_SITE_OTHER): Payer: Medicare Other | Admitting: Obstetrics & Gynecology

## 2014-06-02 VITALS — BP 100/64 | HR 68 | Resp 18 | Ht 63.25 in | Wt 188.0 lb

## 2014-06-02 DIAGNOSIS — F3289 Other specified depressive episodes: Secondary | ICD-10-CM

## 2014-06-02 DIAGNOSIS — F329 Major depressive disorder, single episode, unspecified: Secondary | ICD-10-CM

## 2014-06-02 DIAGNOSIS — F32A Depression, unspecified: Secondary | ICD-10-CM

## 2014-06-02 MED ORDER — BUPROPION HCL ER (XL) 150 MG PO TB24: 150.0000 mg | ORAL_TABLET | Freq: Every day | ORAL | Status: DC

## 2014-06-02 NOTE — Progress Notes (Signed)
73 y.o. Married Caucasian female G0P0000 here to discuss her current antidepressant and issues with constipation.  She is doing really well with the celexa, from a mood standpoint, and desires to continue some type of therapy.  She does not, however, like the constipation.    Reports she always has to take metamucil or stool softener once daily.  Now, with the citalopram using Citracel twice daily (four tablets totally) and a stool softener once daily.  Also, on a probiotic.  With this, she is going regularly.  Has experienced this both with lexapro, Cymbalta, and Celexa.  Also, has tried Prozac.  This made her jittery.    O: Healthy WD,WN female Affect: normal  A:Depression Constipation with current medication.  P: Change to Wellbutrin XL 150mg  daily.  Reviewed side effects/risks.  Pt will follow up one month.   ~15 minutes spent with patient >50% of time was in face to face discussion of above.

## 2014-06-16 ENCOUNTER — Telehealth: Payer: Self-pay | Admitting: Obstetrics & Gynecology

## 2014-06-16 NOTE — Telephone Encounter (Signed)
Spoke with patient. Patient states that she was seen on 6/22 with Dr.Miller to discuss antidepressants. Patient was started on Wellbutrin which she begin taking on 6/23. On 7/1 patient began to have a rash on her arms and legs and was itching. "I was feeling so much better on the Wellbutrin. I was not constipated. I had more energy. I wasn't over eating. I wanted it to work so bad." Patient called office on Friday and spoke with Dr.Lathrop who was on call. Patient states that she was instructed to start taking Celexa on Saturday and begin to take miralax prn. "I figured I was supposed to call on Monday to see what I should do now. I really want to stay on something that will work as well as the Wellbutrin." Advised would send a message to [facility] and give patient a call back with further instructions and recommendations. Patient agreeable.

## 2014-06-16 NOTE — Telephone Encounter (Signed)
Patient is having a reaction to Welbutrin. Please call ASAP.

## 2014-06-20 MED ORDER — CITALOPRAM HYDROBROMIDE 20 MG PO TABS: 20.0000 mg | ORAL_TABLET | Freq: Every day | ORAL | Status: DC

## 2014-06-20 NOTE — Telephone Encounter (Signed)
Spoke with patient. Advised of message as seen below from Ashley Henderson.Patient states that she would like to stay on the Ashley Henderson at this time. Patient has been taking Ashley Henderson since speaking with Dr.Lathrop last weekend and has been taking miralax with no constipation issues. "I think I want to give this a try for a little while and if I change my mind I will make an appointment with Dr.McKinney." Advised would let Dr.Miller know. Patient agreeable.  Dr.Miller, patient has refill for Ashley Henderson but it looks like rx was cancelled when we started her on Ashley Henderson. Okay to discontinue Ashley Henderson and reorder Ashley Henderson so patient will have refills available? Patient uses pharmacy on file.

## 2014-06-20 NOTE — Telephone Encounter (Signed)
Rx don for the next year for Celexa 20mg  daily.  Sent to [facility].  Thanks.

## 2014-06-20 NOTE — Telephone Encounter (Signed)
There is nothing else like Wellbutrin and all of the SSRIs (prozac, zoloft, celexa like medications) cause her signficant constipation.  She has been on SNRIs as well (Prestiq, Cymbalta group).  I don't really have any other recommendations but a psychiatrist would.  She will need to call and make the appt.  I think she should go to Dr. Tomasita Crumble McKinney's office.  We cannot make the referral for her.  I have tried all the medications that I am comfortable using.  That doesn't mean there aren't other options, just ones outside of my comfort for prescribing.

## 2014-09-26 ENCOUNTER — Other Ambulatory Visit: Payer: Self-pay

## 2015-01-10 ENCOUNTER — Other Ambulatory Visit: Payer: Self-pay | Admitting: Obstetrics & Gynecology

## 2015-01-12 NOTE — Telephone Encounter (Signed)
Medication refill request: Mycolog ointment  Last AEX:  04/25/14 Next AEX: 05/08/15 Last MMG (if hormonal medication request): 07/04/14 BIRADS2:Benign  Refill authorized: 01/27/14 #30g/0R. Today #30g/0R?

## 2015-01-27 ENCOUNTER — Telehealth: Payer: Self-pay | Admitting: Obstetrics & Gynecology

## 2015-01-27 NOTE — Telephone Encounter (Signed)
Medication refill request: Lexapro 20 mg  Last AEX: 01/01/13 with Dr. Quincy Simmonds Next AEX: 05/08/15 with Dr. Sabra Heck Last MMG (if hormonal medication request): N/A Refill authorized: #30/4 rfs, please advise.  (Chart In your incoming basket)

## 2015-01-27 NOTE — Telephone Encounter (Signed)
Patient is now seeing a psychiatrist Dr. Claudia Desanctis and has been recommended that she start back on Lexapro. Patient is requesting Dr. Jeanie Cooks to fill this prescription she is using  [place].

## 2015-01-28 MED ORDER — ESCITALOPRAM OXALATE 20 MG PO TABS: 20.0000 mg | ORAL_TABLET | Freq: Every day | ORAL | Status: DC

## 2015-01-28 NOTE — Telephone Encounter (Signed)
Rx done.  Thank you for the information.

## 2015-01-29 NOTE — Telephone Encounter (Signed)
LM on patient's vm (confirmed first/last name) that rx has been sent to pharmacy.

## 2015-02-24 ENCOUNTER — Telehealth: Payer: Self-pay

## 2015-02-24 NOTE — Telephone Encounter (Signed)
Left message with husband to have patient call back.//kn

## 2015-02-24 NOTE — Telephone Encounter (Signed)
Patient notified of MMG results. States is not having any pain now and will call back PRN.//kn

## 2015-04-20 ENCOUNTER — Other Ambulatory Visit: Payer: Self-pay | Admitting: *Deleted

## 2015-04-20 NOTE — Telephone Encounter (Signed)
Faxed Medication refill request: Nystatin/Triamcinolone Ointment Last AEX:  04/25/14 with SM Next AEX: 05/08/15 with SM Last MMG (if hormonal medication request): 01/29/15 bi-rads 2: benign Refill authorized: #30 gm/0 rfs, please advise.

## 2015-04-21 MED ORDER — NYSTATIN-TRIAMCINOLONE 100000-0.1 UNIT/GM-% EX OINT: TOPICAL_OINTMENT | Freq: Two times a day (BID) | CUTANEOUS | Status: DC

## 2015-04-21 NOTE — Telephone Encounter (Signed)
Fax faxed back to [facility] stating refills have been sent.

## 2015-05-08 ENCOUNTER — Encounter: Payer: Self-pay | Admitting: Obstetrics & Gynecology

## 2015-05-08 ENCOUNTER — Ambulatory Visit (INDEPENDENT_AMBULATORY_CARE_PROVIDER_SITE_OTHER): Payer: Medicare Other | Admitting: Obstetrics & Gynecology

## 2015-05-08 VITALS — BP 104/62 | HR 60 | Resp 16 | Ht 63.5 in | Wt 188.6 lb

## 2015-05-08 DIAGNOSIS — R339 Retention of urine, unspecified: Secondary | ICD-10-CM | POA: Diagnosis not present

## 2015-05-08 DIAGNOSIS — Z01419 Encounter for gynecological examination (general) (routine) without abnormal findings: Secondary | ICD-10-CM

## 2015-05-08 DIAGNOSIS — Z0000 Encounter for general adult medical examination without abnormal findings: Secondary | ICD-10-CM

## 2015-05-08 LAB — POCT URINALYSIS DIPSTICK
BILIRUBIN UA: NEGATIVE
Blood, UA: NEGATIVE
GLUCOSE UA: NEGATIVE
Ketones, UA: NEGATIVE
Leukocytes, UA: NEGATIVE
NITRITE UA: NEGATIVE
Protein, UA: NEGATIVE
Urobilinogen, UA: NEGATIVE
pH, UA: 6

## 2015-05-08 MED ORDER — ESCITALOPRAM OXALATE 20 MG PO TABS: 20.0000 mg | ORAL_TABLET | Freq: Every day | ORAL | Status: DC

## 2015-05-08 NOTE — Progress Notes (Addendum)
74 y.o. G0P0000 MarriedCaucasianF here for annual exam.  Doing well.  No vaginal bleeding.  Only issue is feeling like she is not emptying her bladder.  At times, after voiding, she has leakage.    PCP:  Dr. Laurann Montana.  Does all lab work.  Last done within the last year.     Patient's last menstrual period was 12/13/1995.          Sexually active: No.  The current method of family planning is post menopausal status.    Exercising: Yes.    cardio and trainer Smoker:  no  Health Maintenance: Pap:  04/25/14 WNL History of abnormal Pap:  no MMG:  07/04/14 3D-normal, 01/29/15 3D Right Diag after a fall--was normal Colonoscopy:  09/11/13-repeat in 10 years Dr Olevia Perches BMD:   1/09-normal TDaP:  8/12 Screening Labs: PCP, Hb today: PCP, Urine today: PH-6.0   reports that she has never smoked. She has never used smokeless tobacco. She reports that she drinks about 0.5 oz of alcohol per week. She reports that she does not use illicit drugs.  Past Medical History  Diagnosis Date  . Hemorrhoids   . Hyperplastic colon polyp   . Diverticulosis   . Anxiety   . Depression   . Glaucoma   . IBS (irritable bowel syndrome)     using align-saw Dr Olevia Perches in 2014  . Hypothyroidism   . Hematuria 1994    negative cysto, IVP  . Abnormal uterine bleeding (AUB)     endo biopsy negative  . Colon polyp   . Hyperlipidemia   . Plantar fasciitis   . ADD (attention deficit disorder)     Past Surgical History  Procedure Laterality Date  . Simple mastectomy  1984    bilateral with implants  . Glaucoma surgery      bilateral laser  . Shoulder surgery  3/03, 9/01    bilateral  . Breast implants replaced  6/08    Current Outpatient Prescriptions  Medication Sig Dispense Refill  . aspirin 81 MG tablet Take 81 mg by mouth daily.    . Cyanocobalamin (VITAMIN B 12 PO) Take by mouth daily.    Mariane Baumgarten Calcium (STOOL SOFTENER PO) Take by mouth. 3 daily    . escitalopram (LEXAPRO) 20 MG tablet Take 1 tablet (20  mg total) by mouth daily. 30 tablet 4  . FIBER PO Take by mouth 2 (two) times daily.    . fluticasone (FLONASE) 50 MCG/ACT nasal spray Place 2 sprays into the nose as needed.    . gabapentin (NEURONTIN) 300 MG capsule Take 300 mg by mouth at bedtime.    Marland Kitchen levothyroxine (SYNTHROID, LEVOTHROID) 75 MCG tablet Take 75 mcg by mouth daily.    Marland Kitchen loratadine (CLARITIN) 10 MG tablet Take 10 mg by mouth daily as needed for allergies.    Marland Kitchen nystatin-triamcinolone ointment (MYCOLOG) Apply topically 2 (two) times daily. 30 g 0  . Omega-3 Fatty Acids (FISH OIL) 1000 MG CAPS Take 1 capsule by mouth daily.    . Polyethylene Glycol 3350 (MIRALAX PO) Take by mouth as needed.    . Probiotic Product (ALIGN) 4 MG CAPS Take 1 tablet by mouth daily.     No current facility-administered medications for this visit.    Family History  Problem Relation Age of Onset  . Breast cancer Mother   . Bone cancer Mother   . Colon cancer Neg Hx   . Breast cancer Maternal Grandmother   . Breast cancer Maternal  Aunt   . Diabetes Mother   . Breast cancer Paternal Aunt   . Cancer Paternal Uncle     ?, x 2 uncles  . Heart attack Father     ROS:  Pertinent items are noted in HPI.  Otherwise, a comprehensive ROS was negative.  Exam:   BP 104/62 mmHg  Pulse 60  Resp 16  Ht 5' 3.5" (1.613 m)  Wt 188 lb 9.6 oz (85.548 kg)  BMI 32.88 kg/m2  LMP 12/13/1995  Weight change: stable  Height: 5' 3.5" (161.3 cm)  Ht Readings from Last 3 Encounters:  05/08/15 5' 3.5" (1.613 m)  06/02/14 5' 3.25" (1.607 m)  04/25/14 5' 3.25" (1.607 m)    General appearance: alert, cooperative and appears stated age Head: Normocephalic, without obvious abnormality, atraumatic Neck: no adenopathy, supple, symmetrical, trachea midline and thyroid normal to inspection and palpation Lungs: clear to auscultation bilaterally Breasts: normal appearance, no masses or tenderness Heart: regular rate and rhythm Abdomen: soft, non-tender; bowel sounds  normal; no masses,  no organomegaly Extremities: extremities normal, atraumatic, no cyanosis or edema Skin: Skin color, texture, turgor normal. No rashes or lesions Lymph nodes: Cervical, supraclavicular, and axillary nodes normal. No abnormal inguinal nodes palpated Neurologic: Grossly normal   Pelvic: External genitalia:  no lesions              Urethra:  normal appearing urethra with no masses, tenderness or lesions              Bartholins and Skenes: normal                 Vagina: normal appearing vagina with normal color and discharge, no lesions              Cervix: no lesions              Pap taken: No. Bimanual Exam:  Uterus:  normal size, contour, position, consistency, mobility, non-tender              Adnexa: normal adnexa and no mass, fullness, tenderness               Rectovaginal: Confirms               Anus:  normal sphincter tone, no lesions  Chaperone was present for exam.  A:  Well Woman with normal exam H/O hypothyroidism Strong family hx of breast cancer, s/p simple mastectomy 1984, neg BRCA/BART testing Incomplete bladder emptying  P: Mammogram yearly. Doing 3D MMG. pap smear obtained 2015.  No pap today.   Labs with PCP Lexapro 24m daily. Rx to pharmacy. Referral to Dr. MMatilde Sprang return annually or prn

## 2015-05-08 NOTE — Addendum Note (Signed)
Addended by: Megan Salon on: 05/08/2015 12:04 PM   Modules accepted: Orders, SmartSet

## 2015-06-08 ENCOUNTER — Other Ambulatory Visit: Payer: Self-pay

## 2015-07-17 ENCOUNTER — Other Ambulatory Visit: Payer: Self-pay | Admitting: Obstetrics & Gynecology

## 2015-07-17 NOTE — Telephone Encounter (Signed)
Medication refill request: Mycololog Last AEX:  05/08/15 with SM Next AEX: 08/02/16 with SM  Last MMG (if hormonal medication request): n/a Refill authorized: #30 gm

## 2015-12-18 ENCOUNTER — Telehealth: Payer: Self-pay | Admitting: Internal Medicine

## 2015-12-18 NOTE — Telephone Encounter (Signed)
Spoke with patient and she states she has seen Dr. Olevia Perches in past. She wants Dr. Silverio Decamp now. She has hx of IBS. States on Wednesday, she had cramping, abdominal pain, vomiting and BRB in stools with some clots and mucous. She continued this yesterday. Today, she reports cramping and blood in stools. Patient instructed to see her PCM or go to urgent care for evaluation of blood, clots in stool x 3 days. Patient states she understands this and she will go if she feels she needs to do so. She wants to schedule next week with an APP. Scheduled with Alonza Bogus, PA on 12/23/15 at 1:30 PM.

## 2015-12-18 NOTE — Telephone Encounter (Signed)
Ok, thanks.

## 2015-12-23 ENCOUNTER — Ambulatory Visit (INDEPENDENT_AMBULATORY_CARE_PROVIDER_SITE_OTHER): Payer: Medicare Other | Admitting: Gastroenterology

## 2015-12-23 ENCOUNTER — Encounter: Payer: Self-pay | Admitting: Gastroenterology

## 2015-12-23 ENCOUNTER — Other Ambulatory Visit (INDEPENDENT_AMBULATORY_CARE_PROVIDER_SITE_OTHER): Payer: Medicare Other

## 2015-12-23 VITALS — BP 98/56 | HR 68 | Ht 63.0 in | Wt 193.4 lb

## 2015-12-23 DIAGNOSIS — K589 Irritable bowel syndrome without diarrhea: Secondary | ICD-10-CM

## 2015-12-23 DIAGNOSIS — K625 Hemorrhage of anus and rectum: Secondary | ICD-10-CM

## 2015-12-23 LAB — COMPREHENSIVE METABOLIC PANEL
ALT: 6 U/L (ref 0–35)
AST: 17 U/L (ref 0–37)
Albumin: 4.4 g/dL (ref 3.5–5.2)
Alkaline Phosphatase: 71 U/L (ref 39–117)
BUN: 12 mg/dL (ref 6–23)
CHLORIDE: 99 meq/L (ref 96–112)
CO2: 30 meq/L (ref 19–32)
Calcium: 9.7 mg/dL (ref 8.4–10.5)
Creatinine, Ser: 0.71 mg/dL (ref 0.40–1.20)
GFR: 85.98 mL/min (ref >60.00)
Glucose, Bld: 83 mg/dL (ref 70–99)
POTASSIUM: 4.2 meq/L (ref 3.5–5.1)
Sodium: 137 mEq/L (ref 135–145)
Total Bilirubin: 1.1 mg/dL (ref 0.2–1.2)
Total Protein: 7.5 g/dL (ref 6.0–8.3)

## 2015-12-23 LAB — CBC WITH DIFFERENTIAL/PLATELET
BASOS PCT: 0.3 % (ref 0.0–3.0)
Basophils Absolute: 0 10*3/uL (ref 0.0–0.1)
Eosinophils Absolute: 0.2 10*3/uL (ref 0.0–0.7)
Eosinophils Relative: 2.4 % (ref 0.0–5.0)
HCT: 45 % (ref 36.0–46.0)
HEMOGLOBIN: 14.9 g/dL (ref 12.0–15.0)
Lymphocytes Relative: 40.3 % (ref 12.0–46.0)
Lymphs Abs: 2.7 10*3/uL (ref 0.7–4.0)
MCHC: 33.2 g/dL (ref 30.0–36.0)
MCV: 94.7 fl (ref 78.0–100.0)
MONO ABS: 0.6 10*3/uL (ref 0.1–1.0)
Monocytes Relative: 8.5 % (ref 3.0–12.0)
NEUTROS PCT: 48.5 % (ref 43.0–77.0)
Neutro Abs: 3.2 10*3/uL (ref 1.4–7.7)
Platelets: 325 10*3/uL (ref 150.0–400.0)
RBC: 4.75 Mil/uL (ref 3.87–5.11)
RDW: 13 % (ref 11.5–15.5)
WBC: 6.7 10*3/uL (ref 4.0–10.5)

## 2015-12-23 LAB — TSH: TSH: 7.92 u[IU]/mL — AB (ref 0.35–4.50)

## 2015-12-23 MED ORDER — DICYCLOMINE HCL 10 MG PO CAPS: 10.0000 mg | ORAL_CAPSULE | Freq: Two times a day (BID) | ORAL | Status: DC | PRN

## 2015-12-23 NOTE — Patient Instructions (Signed)
Your physician has requested that you go to the basement for  lab work before leaving today.   

## 2015-12-25 ENCOUNTER — Encounter: Payer: Self-pay | Admitting: Gastroenterology

## 2015-12-25 NOTE — Progress Notes (Signed)
     12/25/2015 Ashley Henderson [id number] 1941-09-29   History of Present Illness:  This is a pleasant 75 year old female who is previously known to Dr. Olevia Perches; her care will be assumed by Dr. Silverio Decamp. Last colonoscopy was in October 2014 at which time she was found to have only internal hemorrhoids. This is thought to be the source of her rectal bleeding at that time and she was treated with suppositories.  Random biopsies throughout her colon to explain diarrhea were normal/negative for microscopic colitis. She has a diagnosis of IBS. She says that last week she had a "flare" of her IBS with a bout of diarrhea, nausea, and stomach cramping. Then, on Friday she had an episode of rectal bleeding described as bright red blood on the toilet paper and in the toilet. It resolved and she has not had any further bleeding since that time. She says that overall her GI symptoms have resolved, but today she feels a little bit weak and shaky. She takes a daily probiotic. Usually she has constipation so she uses MiraLAX, fiber supplements, and stool softeners daily, but sometimes she gets these bouts of diarrhea as well.  She had dicyclomine at home, which she uses as needed and is asking for a new prescription.  Current Medications, Allergies, Past Medical History, Past Surgical History, Family History and Social History were reviewed in [employer] record.   Physical Exam: BP 98/56 mmHg  Pulse 68  Ht 5\' 3"  (1.6 m)  Wt 193 lb 6 oz (87.714 kg)  BMI 34.26 kg/m2  LMP 12/13/1995 General: Well developed white female in no acute distress Head: Normocephalic and atraumatic Eyes:  Sclerae anicteric, conjunctiva pink  Ears: Normal auditory acuity Lungs: Clear throughout to auscultation Heart: Regular rate and rhythm Abdomen: Soft, non-distended.  Normal bowel sounds.  Non-tender. Rectal:  No external abnormalities noted.  DRE did not reveal any masses and stool was light brown and  hemoccult negative.  Anoscopy showed internal hemorrhoids. Musculoskeletal: Symmetrical with no gross deformities  Extremities: No edema  Neurological: Alert oriented x 4, grossly non-focal Psychological:  Alert and cooperative. Normal mood and affect  Assessment and Recommendations: -Rectal bleeding:  Limited, likely due to internal hemorrhoids.  Now resolved.  Will check CBC along with CMP and TSH at patient's request.  Will monitor for now. -IBS:  Primarily constipation but has bouts of diarrhea as well. We'll continue her daily probiotic, fiber supplement, MiraLAX, and stool softeners. We will renew her dicyclomine prescription to use as needed.

## 2015-12-28 NOTE — Progress Notes (Signed)
Reviewed and agree with documentation and assessment and plan. K. Veena Srah Ake , MD   

## 2016-02-05 ENCOUNTER — Other Ambulatory Visit: Payer: Self-pay | Admitting: Obstetrics & Gynecology

## 2016-02-05 NOTE — Telephone Encounter (Signed)
Medication refill request: Mycolog Last AEX:  05/08/15 MSM Next AEX: 08/02/16 MSM Last MMG (if hormonal medication request): 01/29/15 BIRADS Category 2 Benign Refill authorized: 07/17/15 #30 g 1 Refill  Today: #30g 1 Refill Please advise ?

## 2016-05-24 ENCOUNTER — Ambulatory Visit (INDEPENDENT_AMBULATORY_CARE_PROVIDER_SITE_OTHER): Payer: Medicare Other | Admitting: Obstetrics & Gynecology

## 2016-05-24 ENCOUNTER — Encounter: Payer: Self-pay | Admitting: Obstetrics & Gynecology

## 2016-05-24 VITALS — BP 98/58 | HR 60 | Resp 16 | Ht 63.0 in | Wt 183.8 lb

## 2016-05-24 DIAGNOSIS — E039 Hypothyroidism, unspecified: Secondary | ICD-10-CM | POA: Diagnosis not present

## 2016-05-24 DIAGNOSIS — Z01419 Encounter for gynecological examination (general) (routine) without abnormal findings: Secondary | ICD-10-CM | POA: Diagnosis not present

## 2016-05-24 DIAGNOSIS — Z0000 Encounter for general adult medical examination without abnormal findings: Secondary | ICD-10-CM

## 2016-05-24 DIAGNOSIS — L292 Pruritus vulvae: Secondary | ICD-10-CM | POA: Diagnosis not present

## 2016-05-24 DIAGNOSIS — Z124 Encounter for screening for malignant neoplasm of cervix: Secondary | ICD-10-CM | POA: Diagnosis not present

## 2016-05-24 DIAGNOSIS — G4700 Insomnia, unspecified: Secondary | ICD-10-CM

## 2016-05-24 LAB — POCT URINALYSIS DIPSTICK
BILIRUBIN UA: NEGATIVE
Blood, UA: NEGATIVE
GLUCOSE UA: NEGATIVE
KETONES UA: NEGATIVE
LEUKOCYTES UA: NEGATIVE
Nitrite, UA: NEGATIVE
PROTEIN UA: NEGATIVE
Urobilinogen, UA: NEGATIVE
pH, UA: 7

## 2016-05-24 MED ORDER — ESCITALOPRAM OXALATE 10 MG PO TABS: 10.0000 mg | ORAL_TABLET | Freq: Every day | ORAL | Status: DC

## 2016-05-24 NOTE — Progress Notes (Signed)
75 y.o. G0P0000 MarriedCaucasianF here for annual exam.  Doing well.  Denies vaginal bleeding.    Pt reports episode of rectal bleeding in January.  She was seen and diagnosed with hemorrhoids.  Bleeding has not occurred since then.  Last colonoscopy was in 10/14.    Having vulvar itching.  Using Nystatin cream with some success.  Feels like it comes right back after she stops using it.  Has requested several refills.  Husband has been diagnosed with prostate cancer.  Just being followed right now.    PCP:  Dr. Laurann Montana.  Does yearly blood work.  Last done 8/16.  Has appt this August.  Reports her TSH was elevated.  Thyroid medication was adjusted.     Patient's last menstrual period was 12/13/1995.          Sexually active: No.  The current method of family planning is none.    Exercising: Yes.    treadmill, trainer Smoker:  no  Health Maintenance: Pap:  04/25/2014 negative  History of abnormal Pap:  no MMG:  02/22/16 BIRADS 2 benign  Colonoscopy:  09/11/2013 hemorrhoids repeat 7 years BMD:   02/22/16 osteopenia  TDaP:  Up to date with PCP  Pneumonia vaccine(s):  ~ 5 years Zostavax:   ~5 years  Hep C testing: not indicated  Screening Labs: PCP, Hb today: PCP, Urine today: normal    reports that she has never smoked. She has never used smokeless tobacco. She reports that she drinks about 0.5 oz of alcohol per week. She reports that she does not use illicit drugs.  Past Medical History  Diagnosis Date  . Hemorrhoids   . Hyperplastic colon polyp   . Diverticulosis   . Anxiety   . Depression   . Glaucoma   . IBS (irritable bowel syndrome)     using align-saw Dr Olevia Perches in 2014  . Hypothyroidism   . Hematuria 1994    negative cysto, IVP  . Abnormal uterine bleeding (AUB)     endo biopsy negative  . Colon polyp   . Hyperlipidemia   . Plantar fasciitis   . ADD (attention deficit disorder)     Past Surgical History  Procedure Laterality Date  . Simple mastectomy  1984   bilateral with implants  . Glaucoma surgery      bilateral laser  . Shoulder surgery  3/03, 9/01    bilateral  . Breast implants replaced  6/08    Current Outpatient Prescriptions  Medication Sig Dispense Refill  . aspirin 81 MG tablet Take 81 mg by mouth daily.    Marland Kitchen dicyclomine (BENTYL) 10 MG capsule Take 1 capsule (10 mg total) by mouth 2 (two) times daily as needed for spasms. 60 capsule 2  . Docusate Calcium (STOOL SOFTENER PO) Take 1 tablet by mouth daily.     Marland Kitchen escitalopram (LEXAPRO) 20 MG tablet Take 1 tablet (20 mg total) by mouth daily. 30 tablet 12  . FIBER PO Take by mouth 2 (two) times daily.    . fluticasone (FLONASE) 50 MCG/ACT nasal spray Place 2 sprays into the nose as needed.    . gabapentin (NEURONTIN) 300 MG capsule Take 300 mg by mouth at bedtime.    Marland Kitchen levothyroxine (SYNTHROID, LEVOTHROID) 75 MCG tablet Take 75 mcg by mouth daily.    Marland Kitchen loratadine (CLARITIN) 10 MG tablet Take 10 mg by mouth daily as needed for allergies.    Marland Kitchen nystatin-triamcinolone ointment (MYCOLOG) APPLY TWICE DAILY 30 g 1  .  Omega-3 Fatty Acids (FISH OIL) 1000 MG CAPS Take 1 capsule by mouth daily.    . Polyethylene Glycol 3350 (MIRALAX PO) Take by mouth as needed.    . Probiotic Product (ALIGN) 4 MG CAPS Take 1 tablet by mouth daily.     No current facility-administered medications for this visit.    Family History  Problem Relation Age of Onset  . Breast cancer Mother   . Bone cancer Mother   . Colon cancer Neg Hx   . Breast cancer Maternal Grandmother   . Breast cancer Maternal Aunt   . Diabetes Mother   . Breast cancer Paternal Aunt   . Cancer Paternal Uncle     ?, x 2 uncles  . Heart attack Father     ROS:  Pertinent items are noted in HPI.  Otherwise, a comprehensive ROS was negative.  Exam:   Filed Vitals:   05/24/16 1047  BP: 98/58  Pulse: 60  Resp: 16  Height: 5' 3"  (1.6 m)  Weight: 183 lb 12.8 oz (83.371 kg)    General appearance: alert, cooperative and appears  stated age Head: Normocephalic, without obvious abnormality, atraumatic Neck: no adenopathy, supple, symmetrical, trachea midline and thyroid normal to inspection and palpation Lungs: clear to auscultation bilaterally Breasts: normal appearance, no masses or tenderness, implants present bilaterally Heart: regular rate and rhythm Abdomen: soft, non-tender; bowel sounds normal; no masses,  no organomegaly Extremities: extremities normal, atraumatic, no cyanosis or edema Skin: Skin color, texture, turgor normal. No rashes or lesions Lymph nodes: Cervical, supraclavicular, and axillary nodes normal. No abnormal inguinal nodes palpated Neurologic: Grossly normal   Pelvic: External genitalia:  Bilateral lesions at inner side of inferior labia majora that is thickened, rough, and slightly hypopigmented in appearance              Urethra:  normal appearing urethra with no masses, tenderness or lesions              Bartholins and Skenes: normal                 Vagina: normal appearing vagina with normal color and discharge, no lesions              Cervix: no lesions              Pap taken: Yes.   Bimanual Exam:  Uterus:  normal size, contour, position, consistency, mobility, non-tender              Adnexa: normal adnexa and no mass, fullness, tenderness               Rectovaginal: Confirms               Anus:  normal sphincter tone, no lesions  Chaperone was present for exam.  A:  Well Woman with normal exam H/O hypothyroidism  Strong family hx of breast cancer.  s/p simple mastectomy 1984.  She's had BRCA/BART testing Insomnia Depressed mood in the past Incomplete bladder emptying.  Saw Dr. Matilde Sprang last year.  Evaluation was negative. Recurrent vulvar skin itching  P: Mammogram yearly. Doing 3D MMG. pap smear obtained 2015. Pap obtained today. Labs with PCP.  She already has an appt in August. Will decrease Lexapro 3m daily. #30/12.  Pt interested in decreased to 559m  possibly.  Aware she can cut in 1/2 if desired to decrease further.  Rx to pharmacy. Vulvar biopsy to pathology.  Results will be called to pt. return annually or  prn

## 2016-05-25 DIAGNOSIS — G4700 Insomnia, unspecified: Secondary | ICD-10-CM | POA: Insufficient documentation

## 2016-05-25 DIAGNOSIS — E039 Hypothyroidism, unspecified: Secondary | ICD-10-CM | POA: Insufficient documentation

## 2016-05-25 LAB — IPS PAP SMEAR ONLY

## 2016-05-27 ENCOUNTER — Other Ambulatory Visit: Payer: Self-pay | Admitting: *Deleted

## 2016-05-27 MED ORDER — HYDROXYZINE PAMOATE 25 MG PO CAPS: ORAL_CAPSULE | ORAL | Status: DC

## 2016-05-27 NOTE — Telephone Encounter (Signed)
-----   Message from Nunzio Cobbs, MD sent at 05/27/2016  5:41 AM EDT ----- Please report vulvar biopsy result to the patient.  She has lichen simplex chronicus.  This is actually a skin change as a result of the itching.  There is no cancer or precancer.   I am recommending Vistaril 25 mg at bedtime. This should help to control the itching.  It may sedate, so be aware of possible sleepiness or dizziness.  It can be used 2 - 3 times a day if needed.  Please report back how it is working.   Please note that Dr. Sabra Heck will review the biopsy report and make any final recommendations.   Cc- Dr. Sabra Heck, Karmen Bongo

## 2016-05-27 NOTE — Telephone Encounter (Signed)
Patient notified of results as directed by Dr Quincy Simmonds. Precautions given for Vistaril. Instructed to call back with update on how this works for her. Also advised Dr Sabra Heck will review results when returns to office and we will call if any additional instructions. Patient agreeable.

## 2016-06-07 ENCOUNTER — Telehealth: Payer: Self-pay | Admitting: *Deleted

## 2016-06-07 NOTE — Telephone Encounter (Signed)
Patient returned call and was updated of message from Dr. Sabra Heck. Patient verbalized understanding. Patient scheduled for one month recheck on 06/23/2016 at 1115. Patient agreeable to date and time. Will close encounter.

## 2016-06-07 NOTE — Telephone Encounter (Signed)
-----   Message from Megan Salon, MD sent at 06/07/2016  7:03 AM EDT ----- Please let pt know that this is a skin condition caused by something topical that she is using.  The result it some topical product is making her have the skin changes.  The vaseline is to help give the skin a barrier.  She needs to think about the things she uses--soap, toilet paper, detergent.  Use scott toilet paper, dye free and fragrance free products.  She can stop using the vistaril if it makes her too sleepy.  I do want to recheck in one month.

## 2016-06-07 NOTE — Telephone Encounter (Signed)
Returned call to patient. Patient's husband stated patient was out walking dog. Asked patient's husband to have her call the office at her convenience.

## 2016-06-22 ENCOUNTER — Telehealth: Payer: Self-pay | Admitting: *Deleted

## 2016-06-22 NOTE — Telephone Encounter (Signed)
Patient called. She has insurance questions.

## 2016-06-23 ENCOUNTER — Encounter: Payer: Self-pay | Admitting: Obstetrics & Gynecology

## 2016-06-23 ENCOUNTER — Ambulatory Visit (INDEPENDENT_AMBULATORY_CARE_PROVIDER_SITE_OTHER): Payer: Medicare Other | Admitting: Obstetrics & Gynecology

## 2016-06-23 VITALS — BP 102/60 | HR 66 | Resp 14 | Wt 181.2 lb

## 2016-06-23 DIAGNOSIS — L280 Lichen simplex chronicus: Secondary | ICD-10-CM | POA: Diagnosis not present

## 2016-06-23 DIAGNOSIS — A6000 Herpesviral infection of urogenital system, unspecified: Secondary | ICD-10-CM | POA: Diagnosis not present

## 2016-06-23 MED ORDER — CLOBETASOL PROPIONATE 0.05 % EX OINT: 1.0000 "application " | TOPICAL_OINTMENT | Freq: Two times a day (BID) | CUTANEOUS | Status: DC

## 2016-06-23 MED ORDER — VALACYCLOVIR HCL 500 MG PO TABS: 500.0000 mg | ORAL_TABLET | Freq: Every day | ORAL | Status: DC

## 2016-06-23 NOTE — Progress Notes (Signed)
GYNECOLOGY  VISIT   HPI: 75 y.o. G48P0000 Married Caucasian female here for follow up after having vulvar biopsy obtained showing lichen simplex chronicus.  She has changed many products she was using.  She states her itching and irritation is better.  She is using vaseline daily but doesn't feel this makes much difference.  Atarax caused too much sedation and so she stopped this.  Denies vaginal bleeding.  D/W pt etiology of lichen simplex chronicus and difficult at times with figuring out cause.  Pads, detergents, toilet paper, fabric softeners, soaps/body washes discussed.  She does have a few more changes she could make and she is willing to try this.  Also, discussed with pt use of topical steroid over next month to see if this will help as well.  Ultimately, goal is to not sure steroid ointment for this and manage only with changing products and vaseline.  She voices understanding.     Pt has questions about h/o vulvar HSV.  Does not treat this because embarassed about getting rx filled at her pharmacy.  She's not sure how much this contributes because it is typically painful.  D/W pt findings on physical exam, symptoms, and biopsy that I do not think HSV is contributing.  Still, I think it is useful for her to use suppressive therapy for the next couple of months to see if this will help at well.  Encouraged pt to get this rx filled somewhere other than her local pharmacy, even in another town if that will help.  She likes this idea.   Patient Active Problem List   Diagnosis Date Noted  . 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 Hypothyroidism 05/25/2016  . Insomnia 05/25/2016  . Rectal bleeding 08/20/2013  . IBS (irritable bowel syndrome) 08/20/2013  . Depression 07/01/2013    Past Medical History  Diagnosis Date  . Hemorrhoids   . Hyperplastic colon polyp   . Diverticulosis   . Anxiety   . Depression   . Glaucoma   . IBS (irritable bowel syndrome)     using align-saw Dr Olevia Perches in 2014  .  Hypothyroidism   . Hematuria 1994    negative cysto, IVP  . Abnormal uterine bleeding (AUB)     endo biopsy negative  . Colon polyp   . Hyperlipidemia   . Plantar fasciitis   . ADD (attention deficit disorder)     Past Surgical History  Procedure Laterality Date  . Simple mastectomy  1984    bilateral with implants  . Glaucoma surgery      bilateral laser  . Shoulder surgery  3/03, 9/01    bilateral  . Breast implants replaced  6/08    MEDS:  Reviewed in EPIC and UTD  ALLERGIES: Biaxin; Other; and Sulfa antibiotics  Family History  Problem Relation Age of Onset  . Breast cancer Mother   . Bone cancer Mother   . Colon cancer Neg Hx   . Breast cancer Maternal Grandmother   . Breast cancer Maternal Aunt   . Diabetes Mother   . Breast cancer Paternal Aunt   . Cancer Paternal Uncle     ?, x 2 uncles  . Heart attack Father    SH:  Married, non smoker  Review of Systems  All other systems reviewed and are negative.   PHYSICAL EXAMINATION:    BP 102/60 mmHg  Pulse 66  Resp 14  Wt 181 lb 3.2 oz (82.192 kg)  LMP 12/13/1995    Physical Exam  Constitutional: She is well-developed, well-nourished, and in no distress.  Abdominal: Soft. Bowel sounds are normal.  Genitourinary: Vagina normal. Vulva exhibits erythema (decreased erythema between/around introitus and rectum where abnormal tissue has been present). Vulva exhibits no exudate, no lesion, no rash and no tenderness.  Lymphadenopathy:       Right: No inguinal adenopathy present.       Left: No inguinal adenopathy present.  Neurological: She is alert. Gait normal.  Skin: Skin is warm and dry.  Psychiatric: Affect normal.   Chaperone was present for exam.  Assessment: Lichen simplex chronicus  H/O HSV  Plan: Trial of clobetasol 0.05% ointment up to two times daily.  Follow up one month.  Hopefully this will fix the itching and then will taper off of it and transition to vaseline only. Valtrex 500mg  po q  day.  Pt knows to increased BID x 3 days with outbreak.

## 2016-06-26 DIAGNOSIS — L280 Lichen simplex chronicus: Secondary | ICD-10-CM | POA: Insufficient documentation

## 2016-07-12 ENCOUNTER — Telehealth: Payer: Self-pay | Admitting: Obstetrics & Gynecology

## 2016-07-12 NOTE — Telephone Encounter (Signed)
Patient wants to speak with Dr. Ammie Ferrier nurse. No information given.

## 2016-07-12 NOTE — Telephone Encounter (Signed)
Patient returned call. She wants Dr. Sabra Heck to know that her itching is "completely better." Still using Mycolog cream.   It has not been one entire month yet, but she wanted to give Dr. Sabra Heck and update.   Advised will send message to Dr. Sabra Heck for follow up instructions. Patient agreeable.

## 2016-07-12 NOTE — Telephone Encounter (Signed)
Message left to return call to Ashley Henderson at [phone].    

## 2016-07-13 NOTE — Telephone Encounter (Signed)
She can start tapering off the medication now and using nightly (if using more than that).  Use nightly for two to three more weeks and then twice weekly for another 3 weeks and then stop.  If using nightly at this point, just start using 2-3 nights weekly for 2-3 weeks.  Thanks.

## 2016-07-13 NOTE — Telephone Encounter (Signed)
Message left to return call to [place] at [phone].  Detailed message left at home per patient request with instructions from Dr. Sabra Heck.  Advised patient to call back with any questions.

## 2016-08-02 ENCOUNTER — Ambulatory Visit: Payer: Medicare Other | Admitting: Obstetrics & Gynecology

## 2017-01-19 ENCOUNTER — Ambulatory Visit
Admission: RE | Admit: 2017-01-19 | Discharge: 2017-01-19 | Disposition: A | Discharge status: Home / Self Care | Payer: Medicare Other | Source: Ambulatory Visit | Attending: Internal Medicine | Admitting: Internal Medicine

## 2017-01-19 ENCOUNTER — Other Ambulatory Visit: Payer: Self-pay | Admitting: Internal Medicine

## 2017-01-19 DIAGNOSIS — J189 Pneumonia, unspecified organism: Secondary | ICD-10-CM

## 2017-04-13 ENCOUNTER — Encounter: Payer: Self-pay | Admitting: Obstetrics & Gynecology

## 2017-06-26 ENCOUNTER — Other Ambulatory Visit: Payer: Self-pay | Admitting: Obstetrics & Gynecology

## 2017-06-26 NOTE — Telephone Encounter (Signed)
eScribe request from [facility] for refill on ESCITALOPRAM Last filled - 05/24/16, #30 X 13 RF Last AEX - 05/24/16 Next AEX - 08/29/17 Last MMG (if hormonal request) - N/A  Please advise refills. Thank you.  Routed to Dr. Quincy Simmonds as Dr. Sabra Heck out of office today.

## 2017-07-04 ENCOUNTER — Other Ambulatory Visit: Payer: Self-pay | Admitting: Orthopedic Surgery

## 2017-07-04 DIAGNOSIS — M545 Low back pain: Secondary | ICD-10-CM

## 2017-07-08 ENCOUNTER — Ambulatory Visit
Admission: RE | Admit: 2017-07-08 | Discharge: 2017-07-08 | Disposition: A | Discharge status: Home / Self Care | Payer: Medicare Other | Source: Ambulatory Visit | Attending: Orthopedic Surgery | Admitting: Orthopedic Surgery

## 2017-07-08 DIAGNOSIS — M545 Low back pain: Secondary | ICD-10-CM

## 2017-07-12 ENCOUNTER — Other Ambulatory Visit: Payer: Self-pay | Admitting: Orthopedic Surgery

## 2017-07-12 DIAGNOSIS — M5136 Other intervertebral disc degeneration, lumbar region: Secondary | ICD-10-CM

## 2017-07-31 ENCOUNTER — Ambulatory Visit
Admission: RE | Admit: 2017-07-31 | Discharge: 2017-07-31 | Disposition: A | Discharge status: Home / Self Care | Payer: Medicare Other | Source: Ambulatory Visit | Attending: Orthopedic Surgery | Admitting: Orthopedic Surgery

## 2017-07-31 DIAGNOSIS — M5136 Other intervertebral disc degeneration, lumbar region: Secondary | ICD-10-CM

## 2017-07-31 MED ORDER — IOPAMIDOL (ISOVUE-M 200) INJECTION 41%
1.0000 mL | Freq: Once | INTRAMUSCULAR | Status: AC
  Administered 2017-07-31: 1 mL via EPIDURAL

## 2017-07-31 MED ORDER — METHYLPREDNISOLONE ACETATE 40 MG/ML INJ SUSP (RADIOLOG
120.0000 mg | Freq: Once | INTRAMUSCULAR | Status: AC
  Administered 2017-07-31: 120 mg via EPIDURAL

## 2017-07-31 NOTE — Discharge Instructions (Signed)

## 2017-08-28 NOTE — Progress Notes (Deleted)
76 y.o. G0P0000 Married Caucasian F here for annual exam.    Patient's last menstrual period was 12/13/1995.          Sexually active: {yes no:314532}  The current method of family planning is post menopausal status.    Exercising: {yes no:314532}  {types:19826} Smoker:  no  Health Maintenance: Pap:  05/24/16 negative, 04/25/14 negative History of abnormal Pap:  no MMG:  03/14/17 BIRADS 2 benign Colonoscopy:  09/11/13 hemorrhoids- repeat 7 years BMD:   02/22/16 osteopenia  TDaP:  UTD with PCP Pneumonia vaccine(s):  *** Zostavax:   *** Hep C testing: not indicated Screening Labs: ***, Hb today: ***, Urine today: ***   reports that she has never smoked. She has never used smokeless tobacco. She reports that she drinks about 0.5 oz of alcohol per week . She reports that she does not use drugs.  Past Medical History:  Diagnosis Date  . Abnormal uterine bleeding (AUB)    endo biopsy negative  . ADD (attention deficit disorder)   . Anxiety   . Colon polyp   . Depression   . Diverticulosis   . Glaucoma   . Hematuria 1994   negative cysto, IVP  . Hemorrhoids   . Hyperlipidemia   . Hyperplastic colon polyp   . Hypothyroidism   . IBS (irritable bowel syndrome)    using align-saw Dr Olevia Perches in 2014  . Plantar fasciitis     Past Surgical History:  Procedure Laterality Date  . breast implants replaced  6/08  . GLAUCOMA SURGERY     bilateral laser  . SHOULDER SURGERY  3/03, 9/01   bilateral  . SIMPLE MASTECTOMY  1984   bilateral with implants    Current Outpatient Prescriptions  Medication Sig Dispense Refill  . aspirin 81 MG tablet Take 81 mg by mouth daily.    . clobetasol ointment (TEMOVATE) 4.09 % Apply 1 application topically 2 (two) times daily. Apply as directed twice daily 60 g 1  . dicyclomine (BENTYL) 10 MG capsule Take 1 capsule (10 mg total) by mouth 2 (two) times daily as needed for spasms. (Patient not taking: Reported on 05/24/2016) 60 capsule 2  . Docusate Calcium  (STOOL SOFTENER PO) Take 1 tablet by mouth daily.     Marland Kitchen escitalopram (LEXAPRO) 10 MG tablet TAKE ONE TABLET EACH DAY 30 tablet 2  . FIBER PO Take by mouth 2 (two) times daily.    . fluticasone (FLONASE) 50 MCG/ACT nasal spray Place 2 sprays into the nose as needed.    . gabapentin (NEURONTIN) 300 MG capsule Take 300 mg by mouth at bedtime.    . hydrOXYzine (VISTARIL) 25 MG capsule Take one capsule at bedtime. May increase to two to three times daily as needed for itching. (Patient not taking: Reported on 06/23/2016) 60 capsule 0  . levothyroxine (SYNTHROID, LEVOTHROID) 75 MCG tablet Take 88 mcg by mouth daily.     Marland Kitchen loratadine (CLARITIN) 10 MG tablet Take 10 mg by mouth daily as needed for allergies.    Marland Kitchen nystatin-triamcinolone ointment (MYCOLOG) APPLY TWICE DAILY (Patient not taking: Reported on 06/23/2016) 30 g 1  . Omega-3 Fatty Acids (FISH OIL) 1000 MG CAPS Take 1 capsule by mouth daily.    . Polyethylene Glycol 3350 (MIRALAX PO) Take by mouth as needed. Reported on 06/23/2016    . Probiotic Product (ALIGN) 4 MG CAPS Take 1 tablet by mouth daily.    . valACYclovir (VALTREX) 500 MG tablet Take 1 tablet (500 mg  total) by mouth daily. 1 tablet po QD. 30 tablet 12   No current facility-administered medications for this visit.     Family History  Problem Relation Age of Onset  . Breast cancer Mother   . Bone cancer Mother   . Colon cancer Neg Hx   . Breast cancer Maternal Grandmother   . Breast cancer Maternal Aunt   . Diabetes Mother   . Breast cancer Paternal Aunt   . Cancer Paternal Uncle        ?, x 2 uncles  . Heart attack Father     ROS:  Pertinent items are noted in HPI.  Otherwise, a comprehensive ROS was negative.  Exam:   LMP 12/13/1995   Weight change: @WEIGHTCHANGE @ Height:      Ht Readings from Last 3 Encounters:  05/24/16 5\' 3"  (1.6 m)  12/23/15 5\' 3"  (1.6 m)  05/08/15 5' 3.5" (1.613 m)    General appearance: alert, cooperative and appears stated age Head:  Normocephalic, without obvious abnormality, atraumatic Neck: no adenopathy, supple, symmetrical, trachea midline and thyroid {EXAM; THYROID:18604} Lungs: clear to auscultation bilaterally Breasts: {Exam; breast:13139::"normal appearance, no masses or tenderness"} Heart: regular rate and rhythm Abdomen: soft, non-tender; bowel sounds normal; no masses,  no organomegaly Extremities: extremities normal, atraumatic, no cyanosis or edema Skin: Skin color, texture, turgor normal. No rashes or lesions Lymph nodes: Cervical, supraclavicular, and axillary nodes normal. No abnormal inguinal nodes palpated Neurologic: Grossly normal   Pelvic: External genitalia:  no lesions              Urethra:  normal appearing urethra with no masses, tenderness or lesions              Bartholins and Skenes: normal                 Vagina: normal appearing vagina with normal color and discharge, no lesions              Cervix: {exam; cervix:14595}              Pap taken: {yes no:314532} Bimanual Exam:  Uterus:  {exam; uterus:12215}              Adnexa: {exam; adnexa:12223}               Rectovaginal: Confirms               Anus:  normal sphincter tone, no lesions  Chaperone was present for exam.  A:  Well Woman with normal exam  P:   {plan; gyn:5269::"mammogram","pap smear","return annually or prn"}

## 2017-08-29 ENCOUNTER — Ambulatory Visit: Payer: Medicare Other | Admitting: Obstetrics & Gynecology

## 2017-09-01 ENCOUNTER — Ambulatory Visit (INDEPENDENT_AMBULATORY_CARE_PROVIDER_SITE_OTHER): Payer: Medicare Other | Admitting: Obstetrics & Gynecology

## 2017-09-01 ENCOUNTER — Encounter: Payer: Self-pay | Admitting: Obstetrics & Gynecology

## 2017-09-01 VITALS — BP 110/64 | HR 74 | Resp 12 | Ht 63.5 in | Wt 185.0 lb

## 2017-09-01 DIAGNOSIS — Z01419 Encounter for gynecological examination (general) (routine) without abnormal findings: Secondary | ICD-10-CM | POA: Diagnosis not present

## 2017-09-01 MED ORDER — ESCITALOPRAM OXALATE 10 MG PO TABS
10.0000 mg | ORAL_TABLET | Freq: Every day | ORAL | 12 refills | Status: DC
Start: 2017-09-01 — End: 2017-10-20

## 2017-09-01 MED ORDER — CLOBETASOL PROPIONATE 0.05 % EX OINT: 1.0000 "application " | TOPICAL_OINTMENT | Freq: Two times a day (BID) | CUTANEOUS | 1 refills | Status: DC

## 2017-09-01 NOTE — Progress Notes (Signed)
76 y.o. G0P0000 MarriedCaucasianF here for annual exam.   Doing well.  No vaginal bleeding.  Having some recurrent itching.  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     PCP:  Dr. Laurann Montana.  Blood work is UTD.  Patient's last menstrual period was 12/13/1995.          Sexually active: No.  The current method of family planning is post menopausal status.    Exercising: Yes.    personal trainer Smoker:  No   Health Maintenance: Pap:  05/24/16 Neg   04/25/14 Neg  History of abnormal Pap:  no MMG:  03/14/17 BIRADS2:Benign  Colonoscopy:  09/11/13 hemorrhoids, repeat 7 years  BMD:   02/22/16 Osteopenia  TDaP:  UTD with PCP  Pneumonia vaccine(s):  ~6 years ago  Zostavax:   ~6 years ago  Hep C testing: not indicated  Screening Labs: PCP, Hb today: PCP   reports that she has never smoked. She has never used smokeless tobacco. She reports that she drinks about 0.5 oz of alcohol per week . She reports that she does not use drugs.  Past Medical History:  Diagnosis Date  . Abnormal uterine bleeding (AUB)    endo biopsy negative  . ADD (attention deficit disorder)   . Anxiety   . Colon polyp   . Depression   . Diverticulosis   . Glaucoma   . Hematuria 1994   negative cysto, IVP  . Hemorrhoids   . Hyperlipidemia   . Hyperplastic colon polyp   . Hypothyroidism   . IBS (irritable bowel syndrome)    using align-saw Dr Olevia Perches in 2014  . Plantar fasciitis     Past Surgical History:  Procedure Laterality Date  . breast implants replaced  6/08  . GLAUCOMA SURGERY     bilateral laser  . SHOULDER SURGERY  3/03, 9/01   bilateral  . SIMPLE MASTECTOMY  1984   bilateral with implants    Current Outpatient Prescriptions  Medication Sig Dispense Refill  . aspirin 81 MG tablet Take 81 mg by mouth daily.    . clobetasol ointment (TEMOVATE) 5.18 % Apply 1 application topically 2 (two) times daily. Apply as directed twice daily 60 g 1  . Dapsone 5 % topical gel     . Docusate Calcium  (STOOL SOFTENER PO) Take 1 tablet by mouth daily.     Marland Kitchen escitalopram (LEXAPRO) 10 MG tablet TAKE ONE TABLET EACH DAY 30 tablet 2  . FIBER PO Take by mouth 2 (two) times daily.    . fluticasone (FLONASE) 50 MCG/ACT nasal spray Place 2 sprays into the nose as needed.    . gabapentin (NEURONTIN) 300 MG capsule Take 300 mg by mouth at bedtime.    Marland Kitchen levothyroxine (SYNTHROID, LEVOTHROID) 88 MCG tablet     . loratadine (CLARITIN) 10 MG tablet Take 10 mg by mouth daily as needed for allergies.    Marland Kitchen nystatin-triamcinolone ointment (MYCOLOG) APPLY TWICE DAILY 30 g 1  . Probiotic Product (ALIGN) 4 MG CAPS Take 1 tablet by mouth daily.    Marland Kitchen tretinoin (RETIN-A) 0.05 % cream     . valACYclovir (VALTREX) 500 MG tablet Take 1 tablet (500 mg total) by mouth daily. 1 tablet po QD. (Patient taking differently: Take 500 mg by mouth as needed. 1 tablet po QD.) 30 tablet 12   No current facility-administered medications for this visit.     Family History  Problem Relation Age of Onset  . Breast cancer Mother   .  Bone cancer Mother   . Diabetes Mother   . Breast cancer Maternal Grandmother   . Breast cancer Maternal Aunt   . Breast cancer Paternal Aunt   . Cancer Paternal Uncle        ?, x 2 uncles  . Heart attack Father   . Colon cancer Neg Hx     ROS:  Pertinent items are noted in HPI.  Otherwise, a comprehensive ROS was negative.  Exam:   BP 110/64 (BP Location: Right Arm, Patient Position: Sitting, Cuff Size: Normal)   Pulse 74   Resp 12   Ht 5' 3.5" (1.613 m)   Wt 185 lb (83.9 kg)   LMP 12/13/1995   BMI 32.26 kg/m     Height: 5' 3.5" (161.3 cm)  Ht Readings from Last 3 Encounters:  09/01/17 5' 3.5" (1.613 m)  05/24/16 5\' 3"  (1.6 m)  12/23/15 5\' 3"  (1.6 m)    General appearance: alert, cooperative and appears stated age Head: Normocephalic, without obvious abnormality, atraumatic Neck: no adenopathy, supple, symmetrical, trachea midline and thyroid normal to inspection and  palpation Lungs: clear to auscultation bilaterally Breasts: normal appearance, no masses or tenderness Heart: regular rate and rhythm Abdomen: soft, non-tender; bowel sounds normal; no masses,  no organomegaly Extremities: extremities normal, atraumatic, no cyanosis or edema Skin: Skin color, texture, turgor normal. No rashes or lesions Lymph nodes: Cervical, supraclavicular, and axillary nodes normal. No abnormal inguinal nodes palpated Neurologic: Grossly normal   Pelvic: External genitalia:  no lesions              Urethra:  normal appearing urethra with no masses, tenderness or lesions              Bartholins and Skenes: normal                 Vagina: normal appearing vagina with normal color and discharge, no lesions              Cervix: no lesions              Pap taken: No. Bimanual Exam:  Uterus:  normal size, contour, position, consistency, mobility, non-tender              Adnexa: normal adnexa and no mass, fullness, tenderness               Rectovaginal: Confirms               Anus:  normal sphincter tone, no lesions  Chaperone was present for exam.  A:  Well Woman with normal exam PMP, no HRT Hypothyroidism Strong family hx of breast cancer.  S/p simplex mastectomy 1984.  Negative genetic testing. Chronic insomnia H/o depression, on lexapro Incomplete bladder emptying and has seen Dr. Matilde Sprang in the past Vulvar lichen simplex chronicus  P:   Mammogram yearly.  Doing 3D. Pap 2017 neg.  No pap today. Lexapro 10mg  daily.  #30/12RF. RF for Clobetasol 0.05% ointment BID for up to 7 days.  #60gram tube/1RF Lab work and vaccinations UTD. return annually or prn

## 2017-09-04 ENCOUNTER — Other Ambulatory Visit: Payer: Self-pay | Admitting: Orthopedic Surgery

## 2017-09-04 DIAGNOSIS — M5136 Other intervertebral disc degeneration, lumbar region: Secondary | ICD-10-CM

## 2017-09-11 ENCOUNTER — Ambulatory Visit
Admission: RE | Admit: 2017-09-11 | Discharge: 2017-09-11 | Disposition: A | Discharge status: Home / Self Care | Payer: Medicare Other | Source: Ambulatory Visit | Attending: Orthopedic Surgery | Admitting: Orthopedic Surgery

## 2017-09-11 DIAGNOSIS — M5136 Other intervertebral disc degeneration, lumbar region: Secondary | ICD-10-CM

## 2017-09-11 MED ORDER — IOPAMIDOL (ISOVUE-M 200) INJECTION 41%
1.0000 mL | Freq: Once | INTRAMUSCULAR | Status: AC
  Administered 2017-09-11: 1 mL via EPIDURAL

## 2017-09-11 MED ORDER — METHYLPREDNISOLONE ACETATE 40 MG/ML INJ SUSP (RADIOLOG
120.0000 mg | Freq: Once | INTRAMUSCULAR | Status: AC
  Administered 2017-09-11: 120 mg via EPIDURAL

## 2017-09-11 NOTE — Discharge Instructions (Signed)

## 2017-10-19 ENCOUNTER — Telehealth: Payer: Self-pay | Admitting: Obstetrics & Gynecology

## 2017-10-19 NOTE — Telephone Encounter (Signed)
Patient requesting to increase her Lexapro to 20mg  to get her through the holidays.

## 2017-10-19 NOTE — Telephone Encounter (Signed)
Spoke with patient. Patient states that she was previously taking Lexapro 20 mg but recently decreased dosage down to taking 10 mg daily. Asking if she can increase to 20 mg daily again to get through the holiday season due to extra stress. Patient is using [facility].

## 2017-10-20 MED ORDER — ESCITALOPRAM OXALATE 20 MG PO TABS: 20.0000 mg | ORAL_TABLET | Freq: Every day | ORAL | 10 refills | Status: DC

## 2017-10-20 NOTE — Telephone Encounter (Signed)
Spoke with patient. Advised rx for Lexapro 20 mg daily take 1 tablet daily #30 10RF has been sent to her pharmacy on file. Patient is agreeable. Encounter closed.

## 2017-10-20 NOTE — Telephone Encounter (Signed)
Yes, she can just increase to 20mg  daily.  Ok to send in rx for pt.

## 2018-07-24 ENCOUNTER — Encounter: Payer: Self-pay | Admitting: Obstetrics & Gynecology

## 2018-10-26 ENCOUNTER — Other Ambulatory Visit: Payer: Self-pay | Admitting: Obstetrics & Gynecology

## 2018-10-26 NOTE — Telephone Encounter (Signed)
Call to patient. Patient states she has enough of the clobetasol ointment to last her until her appointment on Monday with Dr. Sabra Heck. RN advised could refill from aex appointment. Patient agreeable.   Encounter closed.

## 2018-10-29 ENCOUNTER — Other Ambulatory Visit (HOSPITAL_COMMUNITY)
Admission: RE | Admit: 2018-10-29 | Discharge: 2018-10-29 | Disposition: A | Discharge status: Home / Self Care | Payer: Medicare Other | Source: Ambulatory Visit | Attending: Obstetrics & Gynecology | Admitting: Obstetrics & Gynecology

## 2018-10-29 ENCOUNTER — Encounter: Payer: Self-pay | Admitting: Obstetrics & Gynecology

## 2018-10-29 ENCOUNTER — Ambulatory Visit (INDEPENDENT_AMBULATORY_CARE_PROVIDER_SITE_OTHER): Payer: Medicare Other | Admitting: Obstetrics & Gynecology

## 2018-10-29 ENCOUNTER — Other Ambulatory Visit: Payer: Self-pay

## 2018-10-29 VITALS — BP 112/70 | HR 76 | Resp 18 | Ht 63.25 in | Wt 184.2 lb

## 2018-10-29 DIAGNOSIS — Z124 Encounter for screening for malignant neoplasm of cervix: Secondary | ICD-10-CM | POA: Diagnosis not present

## 2018-10-29 DIAGNOSIS — Z01419 Encounter for gynecological examination (general) (routine) without abnormal findings: Secondary | ICD-10-CM | POA: Diagnosis not present

## 2018-10-29 NOTE — Progress Notes (Signed)
77 y.o. G0P0000 Married White or Caucasian female here for annual exam.  Doing well.  Denies vaginal bleeding.  Went to [employer] recently and used a different toilet paper.  Now having some itching.  Needs refill on Clobetasol.  Having a little issue with vertigo.  PCP:  Dr. Lavone Orn.  Blood work was done in October.  Cholesterol was mildly elevated.  Patient's last menstrual period was 12/13/1995.          Sexually active: No.  The current method of family planning is post menopausal status.    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 She has never used smokeless tobacco. She reports that she drinks about 1.0 standard drinks of alcohol per week. She reports that she does not use drugs.  Past Medical History:  Diagnosis Date  . Abnormal uterine bleeding (AUB)    endo biopsy negative  . ADD (attention deficit disorder)   . Anxiety   . Colon polyp   . Depression   . Diverticulosis   . Glaucoma   . Hematuria 1994   negative cysto, IVP  . Hemorrhoids   . Hyperlipidemia   . Hyperplastic colon polyp   . Hypothyroidism   . IBS (irritable bowel syndrome)    using align-saw Dr Olevia Perches in 2014  . Plantar fasciitis   . Vertigo     Past Surgical History:  Procedure Laterality Date  . breast implants replaced  6/08  . GLAUCOMA SURGERY     bilateral laser  . SHOULDER SURGERY  3/03, 9/01   bilateral  . SIMPLE MASTECTOMY  1984   bilateral with implants    Current Outpatient Medications  Medication Sig Dispense Refill  . buPROPion (WELLBUTRIN XL) 300 MG 24 hr tablet Take 1 tablet by mouth daily.    . clobetasol ointment (TEMOVATE) 5.18 % Apply 1 application topically 2 (two)  times daily. Can use for up to 7 days. 60 g 1  . dicyclomine (BENTYL) 20 MG tablet Take 20 mg by mouth every 6 (six) hours.    Mariane Baumgarten Calcium (STOOL SOFTENER PO) Take 1 tablet by mouth daily.     Marland Kitchen FIBER PO Take by mouth 2 (two) times daily.    . fluticasone (FLONASE) 50 MCG/ACT nasal spray Place 2 sprays into the nose as needed.    . gabapentin (NEURONTIN) 300 MG capsule Take 300 mg by mouth at bedtime.    Marland Kitchen levothyroxine (SYNTHROID, LEVOTHROID) 75 MCG tablet Take 1 tablet by mouth every other day.    . levothyroxine (SYNTHROID, LEVOTHROID) 88 MCG tablet Take 88 mcg by mouth every other day.     . loratadine (CLARITIN) 10 MG tablet Take 10 mg by mouth daily as needed for allergies.    Marland Kitchen meclizine (ANTIVERT) 12.5 MG tablet Take 12.5 mg by mouth 3 (three) times daily as needed for dizziness.    . Probiotic Product (ALIGN) 4 MG CAPS Take 1 tablet by mouth daily.     No current facility-administered medications for this visit.     Family History  Problem Relation Age of Onset  . Breast cancer Mother   .  Bone cancer Mother   . Diabetes Mother   . Breast cancer Maternal Grandmother   . Breast cancer Maternal Aunt   . Breast cancer Paternal Aunt   . Cancer Paternal Uncle        ?, x 2 uncles  . Heart attack Father   . Colon cancer Neg Hx     Review of Systems  Constitutional:       Weight gain   Gastrointestinal: Positive for constipation.  Genitourinary:       Loss of urine with sneeze or cough  Vaginal itching   All other systems reviewed and are negative.   Exam:   BP 112/70 (BP Location: Right Arm, Patient Position: Sitting, Cuff Size: Large)   Pulse 76   Resp 18   Ht 5' 3.25" (1.607 m)   Wt 184 lb 3.2 oz (83.6 kg)   LMP 12/13/1995   BMI 32.37 kg/m   Height: 5' 3.25" (160.7 cm)  Ht Readings from Last 3 Encounters:  10/29/18 5' 3.25" (1.607 m)  09/01/17 5' 3.5" (1.613 m)  05/24/16 5\' 3"  (1.6 m)    General appearance: alert, cooperative and appears stated  age Head: Normocephalic, without obvious abnormality, atraumatic Neck: no adenopathy, supple, symmetrical, trachea midline and thyroid normal to inspection and palpation Lungs: clear to auscultation bilaterally Breasts: normal appearance, no masses or tenderness Heart: regular rate and rhythm Abdomen: soft, non-tender; bowel sounds normal; no masses,  no organomegaly Extremities: extremities normal, atraumatic, no cyanosis or edema Skin: Skin color, texture, turgor normal. No rashes or lesions Lymph nodes: Cervical, supraclavicular, and axillary nodes normal. No abnormal inguinal nodes palpated Neurologic: Grossly normal   Pelvic: External genitalia:  Internal erythema of right inner labia minora              Urethra:  normal appearing urethra with no masses, tenderness or lesions              Bartholins and Skenes: normal                 Vagina: normal appearing vagina with normal color and discharge, no lesions              Cervix: no lesions              Pap taken: Yes.   Bimanual Exam:  Uterus:  normal size, contour, position, consistency, mobility, non-tender              Adnexa: no mass, fullness, tenderness               Rectovaginal: Confirms               Anus:  normal sphincter tone, no lesions  Chaperone was present for exam.  A:  Well Woman with normal exam PMP, no HRT Strong family hx of breast cancer.  S/p simple mastectomy 1984.  Negative genetic testing. H/O depression Has seen Dr. Matilde Sprang in the past about incomplete bladder emptying Vulvar lichen simplex chronicus  P:   Mammogram yearly.  Doing 3D MMG. pap smear obtained today RF for Clobetasol 0.055 ointment BID for up to 7 days.  #60grams tube/1RF.  Will continue to use this week.  Recheck in about 14 days. Lab work done with Dr. Laurann Montana.  She is also going to call and check on most recent Tdap. return annually or prn

## 2018-10-29 NOTE — Patient Instructions (Addendum)
Dr. Laurena Slimmer [place], [place], [place] [address]  Phone: [phone]

## 2018-10-30 ENCOUNTER — Telehealth: Payer: Self-pay | Admitting: *Deleted

## 2018-10-30 MED ORDER — CLOBETASOL PROPIONATE 0.05 % EX OINT: 1.0000 "application " | TOPICAL_OINTMENT | Freq: Two times a day (BID) | CUTANEOUS | 1 refills | Status: DC

## 2018-10-30 NOTE — Telephone Encounter (Signed)
Reviewed OV notes dated 10/29/18: RF for Clobetasol 0.055 ointment BID for up to 7 days.  #60grams tube/1RF.  Will continue to use this week.  Recheck in about 14 days.  Rx to pharmacy as seen above.   Patient is scheduled for OV 11/12/18.   Patient notified of refill.   Routing to Dr. Sabra Heck.  Encounter closed.

## 2018-10-30 NOTE — Telephone Encounter (Signed)
Patient says her prescription for clobetasol was not called in to [facility] from yesterday.

## 2018-10-31 ENCOUNTER — Other Ambulatory Visit: Payer: Self-pay | Admitting: Oral Surgery

## 2018-10-31 LAB — CYTOLOGY - PAP
Adequacy: ABSENT
Diagnosis: NEGATIVE

## 2018-11-12 ENCOUNTER — Encounter: Payer: Self-pay | Admitting: Obstetrics & Gynecology

## 2018-11-12 ENCOUNTER — Ambulatory Visit: Payer: Medicare Other | Admitting: Obstetrics & Gynecology

## 2018-11-12 VITALS — BP 116/68 | HR 82 | Resp 16 | Ht 63.25 in | Wt 181.4 lb

## 2018-11-12 DIAGNOSIS — L280 Lichen simplex chronicus: Secondary | ICD-10-CM

## 2018-11-12 NOTE — Progress Notes (Signed)
GYNECOLOGY  VISIT  CC:   Follow up  HPI: 77 y.o. G0P0000 Married White or Caucasian female here for follow up vulvar check.  H/o lichen simplex chronicus.  Using topical steroid with improvement in symptoms.  Denies itching, vaginal discharge or vaginal bleeding.  Did have lesion on tongue removed.  Was a verruca.  Pathology reviewed.  GYNECOLOGIC HISTORY: Patient's last menstrual period was 12/13/1995. Contraception: PMP Menopausal hormone therapy: None  Patient Active Problem List   Diagnosis Date Noted  . Lichen simplex chronicus 06/26/2016  . 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 Hypothyroidism 05/25/2016  . Insomnia 05/25/2016  . Rectal bleeding 08/20/2013  . IBS (irritable bowel syndrome) 08/20/2013  . Depression 07/01/2013    Past Medical History:  Diagnosis Date  . Abnormal uterine bleeding (AUB)    endo biopsy negative  . ADD (attention deficit disorder)   . Anxiety   . Colon polyp   . Depression   . Diverticulosis   . Glaucoma   . Hematuria 1994   negative cysto, IVP  . Hemorrhoids   . Hyperlipidemia   . Hyperplastic colon polyp   . Hypothyroidism   . IBS (irritable bowel syndrome)    using align-saw Dr Olevia Perches in 2014  . Plantar fasciitis   . Vertigo     Past Surgical History:  Procedure Laterality Date  . breast implants replaced  6/08  . GLAUCOMA SURGERY     bilateral laser  . SHOULDER SURGERY  3/03, 9/01   bilateral  . SIMPLE MASTECTOMY  1984   bilateral with implants    MEDS:   Current Outpatient Medications on File Prior to Visit  Medication Sig Dispense Refill  . amoxicillin-clavulanate (AUGMENTIN) 875-125 MG tablet Take 1 tablet by mouth 2 (two) times daily.    Marland Kitchen buPROPion (WELLBUTRIN XL) 300 MG 24 hr tablet Take 1 tablet by mouth daily.    . clobetasol ointment (TEMOVATE) 2.50 % Apply 1 application topically 2 (two) times daily. Can use for up to 7 days. 60 g 1  . dicyclomine (BENTYL) 20 MG tablet Take 20 mg by mouth every 6 (six) hours.    Mariane Baumgarten Calcium (STOOL SOFTENER PO) Take 1 tablet by mouth daily.     Marland Kitchen FIBER PO Take by mouth 2 (two) times daily.    . fluticasone (FLONASE) 50 MCG/ACT nasal spray Place 2 sprays into the nose as needed.    . gabapentin (NEURONTIN) 300 MG capsule Take 300 mg by mouth at bedtime.    Marland Kitchen HYDROcodone-acetaminophen (NORCO/VICODIN) 5-325 MG tablet Take 1 tablet by mouth every 8 (eight) hours as needed.    Marland Kitchen levothyroxine (SYNTHROID, LEVOTHROID) 75 MCG tablet Take 1 tablet by mouth every other day.    . levothyroxine (SYNTHROID, LEVOTHROID) 88 MCG tablet Take 88 mcg by mouth every other day.     . loratadine (CLARITIN) 10 MG tablet Take 10 mg by mouth daily as needed for allergies.    Marland Kitchen meclizine (ANTIVERT) 12.5 MG tablet Take 12.5 mg by mouth 3 (three) times daily as needed for dizziness.    . Probiotic Product (ALIGN) 4 MG CAPS Take 1 tablet by mouth daily.     No current facility-administered medications on file prior to visit.     ALLERGIES: Other; Biaxin [clarithromycin]; and Sulfa antibiotics  Family History  Problem Relation Age of Onset  . Breast cancer Mother   . Bone cancer Mother   . Diabetes Mother   . Breast cancer Maternal Grandmother   .  Breast cancer Maternal Aunt   . Breast cancer Paternal Aunt   . Cancer Paternal Uncle        ?, x 2 uncles  . Heart attack Father   . Colon cancer Neg Hx     SH:  Married, non smoker  Review of Systems  HENT: Positive for congestion.   Genitourinary:       Loss of urine with sneeze or cough   All other systems reviewed and are negative.   PHYSICAL EXAMINATION:    BP 116/68 (BP Location: Left Arm, Patient Position: Sitting, Cuff Size: Large)   Pulse 82   Resp 16   Ht 5' 3.25" (1.607 m)   Wt 181 lb 6.4 oz (82.3 kg)   LMP 12/13/1995   BMI 31.88 kg/m     General appearance: alert, cooperative and appears stated age Lymph:  no inguinal LAD noted  Pelvic: External genitalia:  Significant improvement in pigmentation which is now  much more pink, continued area of hypopigmentation on inner labia minora.  No excoriations or ulcerations.  No lesions.  Chaperone was present for exam.  Assessment: H/o lichen simplex chronicus, improvement on exam noted today  Plan: Pt will taper off of steroid and use barrier method to skin to help with moisture.  Questions answered.

## 2018-11-13 ENCOUNTER — Encounter

## 2018-11-13 ENCOUNTER — Ambulatory Visit: Payer: Medicare Other | Admitting: Obstetrics & Gynecology

## 2019-07-24 ENCOUNTER — Encounter: Payer: Self-pay | Admitting: Obstetrics & Gynecology

## 2019-07-28 ENCOUNTER — Telehealth: Payer: Self-pay | Admitting: Obstetrics and Gynecology

## 2019-07-28 NOTE — Telephone Encounter (Signed)
This is Dr. Quincy Simmonds reviewing results for Dr. Sabra Heck while she is out of the office.   Please share bone density results with the patient. Her bone density results which showed osteopenia, which is early bone thinning but not osteoporosis.  The risk of fracture is considered low and prescription medication is not indicated.   1200 mg of calcium daily, 600 - 800 IU of vitamin D daily, and weight bearing exercise will help to maintain bone density and strength.  Her next bone density is due in 2 years.

## 2019-07-29 NOTE — Telephone Encounter (Signed)
Attempted to reach patient. Voicemail not set up. Unable to leave a message.

## 2019-08-05 NOTE — Telephone Encounter (Signed)
Message left with patient's husband to call Raquel Sarna at [phone]. Okay to speak with per DPR.

## 2019-09-12 NOTE — Telephone Encounter (Signed)
Spoke with patient, advised of results as seen below per Dr. Silva. Patient verbalizes understanding and is agreeable.   Encounter closed.  

## 2019-09-26 ENCOUNTER — Telehealth: Payer: Self-pay | Admitting: Obstetrics & Gynecology

## 2019-09-26 NOTE — Telephone Encounter (Signed)
Patient is experiencing discomfort and rawness in the "crevice of her pubic area".

## 2019-09-26 NOTE — Telephone Encounter (Signed)
Spoke with patient. Patient reports irritation and feeling raw in left and right groin area. Symptoms started 1 wk ago. Denies itching. Worse when wearing clothing. Has been applying baby powder for comfort. Does not want to try an ointment, "messy and will just rub with the elastic". Recommended OV for further evaluation. Offered OV with Melvia Heaps, CNM on 10/16, patient declined. OV scheduled for 10/19 at 1:30pm with Dr. Sabra Heck. Patient asking if symptoms have resolved is OV still needed? Advised patient if symptoms resolve, ok to cancel appt. Recommended OTC Aquaphor as skin barrier in the meantime, patient states she will consider this.   Routing to provider for final review. Patient is agreeable to disposition. Will close encounter.

## 2019-09-30 ENCOUNTER — Ambulatory Visit: Payer: Medicare Other | Admitting: Obstetrics & Gynecology

## 2019-11-04 ENCOUNTER — Telehealth: Payer: Self-pay

## 2019-11-04 ENCOUNTER — Encounter: Payer: Self-pay | Admitting: Obstetrics & Gynecology

## 2019-11-04 NOTE — Telephone Encounter (Signed)
Patient notified of bone density results as written by provider.

## 2019-12-02 ENCOUNTER — Other Ambulatory Visit: Payer: Self-pay | Admitting: Obstetrics & Gynecology

## 2019-12-02 NOTE — Telephone Encounter (Signed)
Medication refill request: Clobetasol  Last AEX:  10-29-18 SM  Next AEX: 03-06-20 Last MMG (if hormonal medication request): 07-24-2019 density A/BIRADS 2 benign  Refill authorized: Today, please advise.   Medication pended for #60g, 1RF. Please refill if appropriate.

## 2019-12-31 ENCOUNTER — Ambulatory Visit: Payer: Medicare PPO | Attending: Internal Medicine

## 2019-12-31 DIAGNOSIS — Z23 Encounter for immunization: Secondary | ICD-10-CM | POA: Insufficient documentation

## 2019-12-31 NOTE — Progress Notes (Signed)
   Covid-19 Vaccination Clinic  Name:  Ashley Henderson    MRN: [id number] DOB: Dec 20, 1940  12/31/2019  [name] was observed post Covid-19 immunization for 15 minutes without incidence. She was provided with Vaccine Information Sheet and instruction to access the V-Safe system.   [name] was instructed to call 911 with any severe reactions post vaccine: Marland Kitchen Difficulty breathing  . Swelling of your face and throat  . A fast heartbeat  . A bad rash all over your body  . Dizziness and weakness    Immunizations Administered    Name Date Dose VIS Date Route   Pfizer COVID-19 Vaccine 12/31/2019 10:30 AM 0.3 mL 11/22/2019 Intramuscular   Manufacturer: [employer]   Lot: [id number]   [place]: [id number]

## 2020-01-13 ENCOUNTER — Encounter: Payer: Self-pay | Admitting: Neurology

## 2020-01-19 ENCOUNTER — Ambulatory Visit: Payer: Medicare PPO | Attending: Internal Medicine

## 2020-01-19 DIAGNOSIS — Z23 Encounter for immunization: Secondary | ICD-10-CM | POA: Insufficient documentation

## 2020-01-19 NOTE — Progress Notes (Signed)
   Covid-19 Vaccination Clinic  Name:  Ashley Henderson    MRN: [id number] DOB: March 03, 1941  01/19/2020  [name] was observed post Covid-19 immunization for 15 minutes without incidence. She was provided with Vaccine Information Sheet and instruction to access the V-Safe system.   [name] was instructed to call 911 with any severe reactions post vaccine: Marland Kitchen Difficulty breathing  . Swelling of your face and throat  . A fast heartbeat  . A bad rash all over your body  . Dizziness and weakness    Immunizations Administered    Name Date Dose VIS Date Route   Pfizer COVID-19 Vaccine 01/19/2020  1:47 PM 0.3 mL 11/22/2019 Intramuscular   Manufacturer: [place]   Lot: [id number]   [place]: [id number]

## 2020-02-12 ENCOUNTER — Encounter: Payer: Self-pay | Admitting: Neurology

## 2020-02-13 NOTE — Progress Notes (Signed)
Ashley Henderson was seen today in the movement disorders clinic for neurologic consultation at the request of Lavone Orn, MD.  The consultation is for the evaluation of R arm tremor.  Outside records that were made available to me were reviewed.  She was started on propranolol, 20 mg twice per day for this on October 26, 2019.  Pt doesn't think it has been helpful  Tremor: Yes.     How long has it been going on? 2-3 years ago started intermittently  At rest or with activation?  activation  When is it noted the most?  Reaching the back to bathe, holding the leash to walk the dog, in the bed with the arm behind the head  Fam hx of tremor?  Yes.  Father with Parkinsons Disease   Located where?  R hand/arm/shoulder  Affected by caffeine:  No.  Affected by alcohol:  No.  Affected by stress:  Yes.    Affected by fatigue:  Yes.    Spills soup if on spoon:  Yes.    Affects ADL's (tying shoes, brushing teeth, etc):  No.  Tremor inducing meds:  No.  Other Specific Symptoms:  Voice: no change - if anything she is louder Sleep: takes gabapentin for sleep  Vivid Dreams:  No.  Acting out dreams:  Yes.  , will yell out some Wet Pillows: Yes.   Postural symptoms:  Yes.    Falls?  Yes.   - last fall she was backing down the garage stairs so she could shut the garage door and was holding the dog leash and fell and hit head.  That was 1-2 years ago.  Bradykinesia symptoms: no bradykinesia noted Loss of smell:  Yes.   -  "gone" x several years Loss of taste:  No. Urinary Incontinence:  No. Difficulty Swallowing:  No. Handwriting, micrographia: Yes.   Depression:  No. but admits to anxiety Memory changes:  Yes.   - prepares own pill box x few years; husband does finances and always has; she cooks but noting some trouble with that multitasking - burning more food than used to  Hallucinations:  No.  visual distortions: No. N/V:  No. Lightheaded:  No.  Syncope: No. Diplopia:  No. Dyskinesia:   No.  Neuroimaging of the brain has not previously been performed.    PREVIOUS MEDICATIONS: none to date  ALLERGIES:   Allergies  Allergen Reactions  . Belviq [Lorcaserin] Other (See Comments)    drowsiness  . Other     antihistamines  . Biaxin [Clarithromycin] Rash  . Sulfa Antibiotics Rash    CURRENT MEDICATIONS:  Current Outpatient Medications  Medication Instructions  . buPROPion (WELLBUTRIN XL) 300 MG 24 hr tablet 1 tablet, Oral, Daily  . clobetasol ointment (TEMOVATE) 0.05 % APPLY TWICE DAILY -CAN USE FOR UPTO 7 DAYS  . dicyclomine (BENTYL) 20 mg, Oral, Every 6 hours, Takes as needed  . Docusate Calcium (STOOL SOFTENER PO) 1 tablet, Oral, Daily  . FIBER PO 2 times daily  . fluticasone (FLONASE) 50 MCG/ACT nasal spray 2 sprays, As needed  . gabapentin (NEURONTIN) 300 mg, Oral, Daily at bedtime  . levothyroxine (SYNTHROID) 88 mcg, Oral, Every other day  . levothyroxine (SYNTHROID, LEVOTHROID) 75 MCG tablet 1 tablet, Oral, Every other day  . loratadine (CLARITIN) 10 mg, Daily PRN  . Magnesium 400 MG CAPS 1 capsule, Oral, Daily, With food   . meclizine (ANTIVERT) 12.5 mg, Oral, 3 times daily PRN  . Probiotic Product (ALIGN)  4 MG CAPS 1 tablet, Daily  . propranolol (INDERAL) 40 mg, Oral, 2 times daily  . psyllium (REGULOID) 0.52 g, Oral, Daily    PHYSICAL EXAMINATION:    VITALS:   Vitals:   02/14/20 1323  BP: 129/71  Pulse: 60  SpO2: 97%  Weight: 196 lb (88.9 kg)  Height: 5' 3.5" (1.613 m)    GEN:  The patient appears stated age and is in NAD. HEENT:  Normocephalic, atraumatic.  The mucous membranes are moist. The superficial temporal arteries are without ropiness or tenderness. CV:  RRR Lungs:  CTAB Neck/HEME:  There are no carotid bruits bilaterally.  Neurological examination:  Orientation: The patient is alert and oriented x3.  Cranial nerves: There is good facial symmetry.  Extraocular muscles are intact. The visual fields are full to confrontational  testing. The speech is fluent and clear. Soft palate rises symmetrically and there is no tongue deviation. Hearing is intact to conversational tone. Sensation: Sensation is intact to light touch throughout (facial, trunk, extremities). Vibration is intact at the bilateral big toe. There is no extinction with double simultaneous stimulation.  Motor: Strength is 5/5 in the bilateral upper and lower extremities.   Shoulder shrug is equal and symmetric.  There is no pronator drift. Deep tendon reflexes: Deep tendon reflexes are 2/4 at the bilateral biceps, triceps, brachioradialis, patella and achilles. Plantar responses are downgoing bilaterally.  Movement examination: Tone: There is mild increased tone in the RUE/RLE Abnormal movements: there is RUE rest tremor that increases with distraction Coordination:  There is  decremation with RAM's, with any form of RAMS, including alternating supination and pronation of the forearm, hand opening and closing, finger taps, heel taps and toe taps on the right Gait and Station: The patient has no difficulty arising out of a deep-seated chair without the use of the hands. The patient's stride length is good with increase in RUE tremor with ambulation.  There is mild decrease armswing on the R.    I have reviewed and interpreted the following labs independently She had a TSH on October 10, 2019.  It was 2.14.  This was the only lab work received from primary care.  ASSESSMENT/PLAN:  1.  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  Father had Parkinson's disease.  -We discussed the diagnosis as well as pathophysiology of the disease.  We discussed treatment options as well as prognostic indicators.  Patient education was provided.  - We talked about medication options as well as potential future surgical options.  We talked about safety in the home.  -We decided to add carbidopa/levodopa 25/100.  1/2 tab tid x 1 wk, then 1/2 in am &  noon & 1 at night for a week, then 1/2 in am &1 at noon &night for a week, then 1 po tid.  Risks, benefits, side effects and alternative therapies were discussed.  The opportunity to ask questions was given and they were answered to the best of my ability.  The patient expressed understanding and willingness to follow the outlined treatment protocols.  -Discussed multidisciplinary care program.  -We discussed community resources in the area including patient support groups and community exercise programs for PD and pt education was provided to the patient.  Will refer to social work for adjustment to diagnosis counseling.  -DC propranolol.  -We will do the genetic testing through [facility].  -Offered a second opinion, which the patient has declined for now.  2.  Follow-up with me in  the next 4 to 5 months, sooner should new neurologic issues arise.  Total time spent on today's visit was 75 minutes, including both face-to-face time and nonface-to-face time.  Time included that spent on review of records (prior notes available to me/labs/imaging if pertinent), discussing treatment and goals, answering patient's questions and coordinating care.  Cc:  Lavone Orn, MD

## 2020-02-14 ENCOUNTER — Encounter: Payer: Self-pay | Admitting: Neurology

## 2020-02-14 ENCOUNTER — Other Ambulatory Visit: Payer: Self-pay

## 2020-02-14 ENCOUNTER — Ambulatory Visit: Payer: Medicare PPO | Admitting: Neurology

## 2020-02-14 VITALS — BP 129/71 | HR 60 | Ht 63.5 in | Wt 196.0 lb

## 2020-02-14 DIAGNOSIS — F4321 Adjustment disorder with depressed mood: Secondary | ICD-10-CM

## 2020-02-14 DIAGNOSIS — G20 Parkinson's disease: Secondary | ICD-10-CM | POA: Diagnosis not present

## 2020-02-14 MED ORDER — CARBIDOPA-LEVODOPA 25-100 MG PO TABS: 1.0000 | ORAL_TABLET | Freq: Three times a day (TID) | ORAL | 1 refills | Status: DC

## 2020-02-14 NOTE — Patient Instructions (Signed)
1.  Stop propranolol 2.  Start Carbidopa Levodopa as follows:  Take 1/2 tablet three times daily, at least 30 minutes before meals (approximately 7am/11am/4pm), for one week  Then take 1/2 tablet in the morning, 1/2 tablet in the afternoon, 1 tablet in the evening, at least 30 minutes before meals, for one week  Then take 1/2 tablet in the morning, 1 tablet in the afternoon, 1 tablet in the evening, at least 30 minutes before meals, for one week  Then take 1 tablet three times daily at 7am/11am/4pm, at least 30 minutes before meals   As a reminder, carbidopa/levodopa can be taken at the same time as a carbohydrate, but we like to have you take your pill either 30 minutes before a protein source or 1 hour after as protein can interfere with carbidopa/levodopa absorption. 3. We will do the genetic testing through [facility]

## 2020-02-20 ENCOUNTER — Telehealth: Payer: Self-pay

## 2020-02-20 ENCOUNTER — Telehealth: Payer: Self-pay | Admitting: Neurology

## 2020-02-20 NOTE — Telephone Encounter (Signed)
Patient notified and voiced understanding and would rather pay forty dollars out of pocket than two hundred and fifty dollars.   Message sent to Roderic Palau at [facility] to move forward with mailing patient the test.

## 2020-02-20 NOTE — Telephone Encounter (Signed)
Received message for [facility] Rep Roderic Palau that patient would have a 50 dollar out of pocket fee for genetic testing. Advised patient to contact office if she does not want to pay for the test.

## 2020-02-20 NOTE — Telephone Encounter (Signed)
Left message with the after hour service on 02-20-20 @ 12:17pm    Patient states that she wants to use the insurance for the Gene test

## 2020-02-26 ENCOUNTER — Telehealth: Payer: Self-pay | Admitting: Neurology

## 2020-02-26 ENCOUNTER — Institutional Professional Consult (permissible substitution): Payer: Medicare PPO | Admitting: Clinical

## 2020-02-26 NOTE — Telephone Encounter (Signed)
Patient has questions about taking her "dopamine" with beans. She wants to know if this is a protein that is okay to take with it.  Also, she'd like to be sure taking her thyroid medication and Wellbutrin with the "dopamine" first thing in the morning is okay.

## 2020-02-26 NOTE — Telephone Encounter (Signed)
Beans are high protein.  As we told her, we are trying to keep levodopa away from protein.  The goal is either 30 min before protein or 1 hour after protein.  Generally, I am okay with taking with thyroid med (sometimes endocrinology is not).  Its fine with wellbutrin

## 2020-02-26 NOTE — Telephone Encounter (Signed)
Left detailed message on patients voicemail with Dr Doristine Devoid recommendations. Advised patient to contact office with any questions.

## 2020-03-05 ENCOUNTER — Other Ambulatory Visit: Payer: Self-pay

## 2020-03-06 ENCOUNTER — Ambulatory Visit: Payer: Self-pay | Admitting: Obstetrics & Gynecology

## 2020-03-06 ENCOUNTER — Encounter: Payer: Self-pay | Admitting: Obstetrics & Gynecology

## 2020-03-06 ENCOUNTER — Telehealth: Payer: Self-pay

## 2020-03-06 NOTE — Telephone Encounter (Signed)
Patient missed appointment today due to confusion regarding Parkinson diagnosis. Appointment rescheduled to 04/13/20.

## 2020-03-06 NOTE — Progress Notes (Deleted)
79 y.o. [place] Married White or Caucasian female here for annual exam.    Patient's last menstrual period was 12/13/1995.          Sexually active: {yes no:314532}  The current method of family planning is post menopausal status.    Exercising: {yes no:314532}  {types:19826} Smoker:  {YES V2345720  Health Maintenance: Pap:  05-24-16 neg, 10-29-18 neg History of abnormal Pap:  no MMG:  07-24-2019 category a density birads 2:neg Colonoscopy:  09-11-13 f/u 2yrs BMD:   2020 osteopenia TDaP:  2012 Pneumonia vaccine(s):  pcp Shingrix:   Had done Hep C testing: *** Screening Labs: ***   reports that she has never smoked. She has never used smokeless tobacco. She reports current alcohol use of about 1.0 standard drinks of alcohol per week. She reports that she does not use drugs.  Past Medical History:  Diagnosis Date  . Abnormal uterine bleeding (AUB)    endo biopsy negative  . ADD (attention deficit disorder)   . Allergic rhinitis   . Anxiety   . Bouchard nodes (DJD hand)   . Claustrophobia   . Colon polyp   . Cystic acne   . Depression   . Diverticulosis   . Glaucoma   . Goiter   . Hematuria 1994   negative cysto, IVP  . Hemorrhoids   . Hyperlipidemia   . Hyperplastic colon polyp   . Hypothyroidism   . IBS (irritable bowel syndrome)    using align-saw Dr Olevia Perches in 2014  . Obesity   . Plantar fasciitis   . Restless leg syndrome   . Vertigo     Past Surgical History:  Procedure Laterality Date  . breast implants replaced  6/08  . GLAUCOMA SURGERY     bilateral laser  . SHOULDER SURGERY  3/03, 9/01   bilateral  . SIMPLE MASTECTOMY  1984   bilateral with implants (prophylactic mastectomy)    Current Outpatient Medications  Medication Sig Dispense Refill  . buPROPion (WELLBUTRIN XL) 300 MG 24 hr tablet Take 1 tablet by mouth daily.    . carbidopa-levodopa (SINEMET IR) 25-100 MG tablet Take 1 tablet by mouth 3 (three) times daily. 270 tablet 1  . clobetasol  ointment (TEMOVATE) 0.05 % APPLY TWICE DAILY -CAN USE FOR UPTO 7 DAYS 60 g 0  . dicyclomine (BENTYL) 20 MG tablet Take 20 mg by mouth every 6 (six) hours. Takes as needed    . Docusate Calcium (STOOL SOFTENER PO) Take 1 tablet by mouth daily.     Marland Kitchen FIBER PO Take by mouth 2 (two) times daily.    . fluticasone (FLONASE) 50 MCG/ACT nasal spray Place 2 sprays into the nose as needed.    . gabapentin (NEURONTIN) 300 MG capsule Take 300 mg by mouth at bedtime.    Marland Kitchen levothyroxine (SYNTHROID, LEVOTHROID) 75 MCG tablet Take 1 tablet by mouth every other day.    . levothyroxine (SYNTHROID, LEVOTHROID) 88 MCG tablet Take 88 mcg by mouth every other day.     . loratadine (CLARITIN) 10 MG tablet Take 10 mg by mouth daily as needed for allergies.    . Magnesium 400 MG CAPS Take 1 capsule by mouth daily. With food    . meclizine (ANTIVERT) 12.5 MG tablet Take 12.5 mg by mouth 3 (three) times daily as needed for dizziness.    . Probiotic Product (ALIGN) 4 MG CAPS Take 1 tablet by mouth daily.    . psyllium (REGULOID) 0.52 g capsule Take 0.52  g by mouth daily.     No current facility-administered medications for this visit.    Family History  Problem Relation Age of Onset  . Breast cancer Mother   . Bone cancer Mother   . Diabetes Mother   . Breast cancer Maternal Grandmother   . Breast cancer Maternal Aunt   . Breast cancer Paternal Aunt   . Cancer Paternal Uncle        ?, x 2 uncles  . Heart attack Father   . Parkinson's disease Father   . Alzheimer's disease Father   . CAD Brother   . Hypertension Brother   . Colon cancer Neg Hx     Review of Systems  Exam:   LMP 12/13/1995   Height:      Ht Readings from Last 3 Encounters:  02/14/20 5' 3.5" (1.613 m)  11/12/18 5' 3.25" (1.607 m)  10/29/18 5' 3.25" (1.607 m)    General appearance: alert, cooperative and appears stated age Head: Normocephalic, without obvious abnormality, atraumatic Neck: no adenopathy, supple, symmetrical, trachea  midline and thyroid {EXAM; THYROID:18604} Lungs: clear to auscultation bilaterally Breasts: {Exam; breast:13139::"normal appearance, no masses or tenderness"} Heart: regular rate and rhythm Abdomen: soft, non-tender; bowel sounds normal; no masses,  no organomegaly Extremities: extremities normal, atraumatic, no cyanosis or edema Skin: Skin color, texture, turgor normal. No rashes or lesions Lymph nodes: Cervical, supraclavicular, and axillary nodes normal. No abnormal inguinal nodes palpated Neurologic: Grossly normal   Pelvic: External genitalia:  no lesions              Urethra:  normal appearing urethra with no masses, tenderness or lesions              Bartholins and Skenes: normal                 Vagina: normal appearing vagina with normal color and discharge, no lesions              Cervix: {exam; cervix:14595}              Pap taken: {yes no:314532} Bimanual Exam:  Uterus:  {exam; uterus:12215}              Adnexa: {exam; adnexa:12223}               Rectovaginal: Confirms               Anus:  normal sphincter tone, no lesions  Chaperone, ***Terence Lux, CMA, was present for exam.  A:  Well Woman with normal exam  P:   {plan; gyn:5269::"mammogram","pap smear","return annually or prn"}

## 2020-03-09 ENCOUNTER — Encounter: Payer: Self-pay | Admitting: Neurology

## 2020-03-11 ENCOUNTER — Institutional Professional Consult (permissible substitution): Payer: Medicare PPO | Admitting: Clinical

## 2020-03-11 ENCOUNTER — Other Ambulatory Visit: Payer: Self-pay

## 2020-03-26 ENCOUNTER — Telehealth: Payer: Self-pay | Admitting: Neurology

## 2020-03-26 NOTE — Telephone Encounter (Signed)
Patient's [facility] genetic testing results for Parkinson's.  There was no pathogenic variants identified.  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  If homozygous, this gene is associated with autosomal dominant focal segmental glomerulosclerosis and autosomal recessive Parkinson's disease and autosomal recessive nephrotic syndrome.   Please let patient know that no genetic variants for Parkinson's disease were seen on her testing.

## 2020-03-26 NOTE — Telephone Encounter (Signed)
Pt called and informed that  no genetic variants for Parkinson's disease were seen on her testing. Pt verbalized understanding

## 2020-04-02 ENCOUNTER — Telehealth: Payer: Self-pay | Admitting: Neurology

## 2020-04-02 NOTE — Telephone Encounter (Signed)
[employer] called regarding request Dr. Carles Collet put in for [facility] Panel. Request was denied due to lack of medical necessity. Appeals form will be sent to office.

## 2020-04-07 NOTE — Progress Notes (Signed)
79 y.o. G0P0000 Married White or Caucasian female here for annual exam.  Doing well.  Denies vaginal bleeding.    Just diagnosed with Parkinson's.  She was having a right hand tremor.  Followed by Dr. Carles Collet.  Going to start doing a Parkinson's class for boxing.  Lost her sense of smell several years ago.  Has been advised this is an early part of the diagnosis.    Patient's last menstrual period was 12/13/1995.          Sexually active: No.  The current method of family planning is post menopausal status.    Exercising: Yes.    usually twice weekly Smoker:  no  Health Maintenance: Pap: 04-25-14 neg,  05-24-16 neg, 10-29-18 neg History of abnormal Pap:  no MMG:  07-24-2019 category a density birads 2:neg Colonoscopy:  09-11-13 f/u 60yrs.  Aware this is due later this year.   BMD:   07-24-2019, -1.3 TDaP:  2012 Pneumonia vaccine(s):  pcp Shingrix:   2019 & 2020 Hep C testing: not done Screening Labs: with Dr. Laurann Montana in the last few months   reports that she has never smoked. She has never used smokeless tobacco. She reports previous alcohol use. She reports that she does not use drugs.  Past Medical History:  Diagnosis Date  . Abnormal uterine bleeding (AUB)    endo biopsy negative  . ADD (attention deficit disorder)   . Allergic rhinitis   . Anxiety   . Bouchard nodes (DJD hand)   . Claustrophobia   . Colon polyp   . Cystic acne   . Depression   . Diverticulosis   . Glaucoma   . Goiter   . Hematuria 1994   negative cysto, IVP  . Hemorrhoids   . Hyperlipidemia   . Hyperplastic colon polyp   . Hypothyroidism   . IBS (irritable bowel syndrome)    using align-saw Dr Olevia Perches in 2014  . Obesity   . Parkinson disease ([place])   . Plantar fasciitis   . Restless leg syndrome   . Vertigo     Past Surgical History:  Procedure Laterality Date  . breast implants replaced  6/08  . GLAUCOMA SURGERY     bilateral laser  . SHOULDER SURGERY  3/03, 9/01   bilateral  . SIMPLE MASTECTOMY   1984   bilateral with implants (prophylactic mastectomy)    Current Outpatient Medications  Medication Sig Dispense Refill  . buPROPion (WELLBUTRIN XL) 300 MG 24 hr tablet Take 1 tablet by mouth daily.    . carbidopa-levodopa (SINEMET IR) 25-100 MG tablet Take 1 tablet by mouth 3 (three) times daily. 270 tablet 1  . clobetasol ointment (TEMOVATE) 0.05 % APPLY TWICE DAILY -CAN USE FOR UPTO 7 DAYS 60 g 0  . dicyclomine (BENTYL) 20 MG tablet Take 20 mg by mouth every 6 (six) hours. Takes as needed    . Docusate Calcium (STOOL SOFTENER PO) Take 1 tablet by mouth daily.     Marland Kitchen FIBER PO Take by mouth 2 (two) times daily.    . fluticasone (FLONASE) 50 MCG/ACT nasal spray Place 2 sprays into the nose as needed.    . gabapentin (NEURONTIN) 300 MG capsule Take 300 mg by mouth at bedtime.    Marland Kitchen levothyroxine (SYNTHROID, LEVOTHROID) 75 MCG tablet Take 1 tablet by mouth every other day.    . levothyroxine (SYNTHROID, LEVOTHROID) 88 MCG tablet Take 88 mcg by mouth every other day.     . loratadine (CLARITIN) 10  MG tablet Take 10 mg by mouth daily as needed for allergies.    . Magnesium 400 MG CAPS Take 1 capsule by mouth daily. With food    . meclizine (ANTIVERT) 12.5 MG tablet Take 12.5 mg by mouth 3 (three) times daily as needed for dizziness.    . Probiotic Product (ALIGN) 4 MG CAPS Take 1 tablet by mouth daily.     No current facility-administered medications for this visit.    Family History  Problem Relation Age of Onset  . Breast cancer Mother   . Bone cancer Mother   . Diabetes Mother   . Breast cancer Maternal Grandmother   . Breast cancer Maternal Aunt   . Breast cancer Paternal Aunt   . Cancer Paternal Uncle        ?, x 2 uncles  . Heart attack Father   . Parkinson's disease Father   . Alzheimer's disease Father   . CAD Brother   . Hypertension Brother   . Colon cancer Neg Hx     Review of Systems  Constitutional: Negative.   HENT: Negative.   Eyes: Negative.   Respiratory:  Negative.   Cardiovascular: Negative.   Gastrointestinal: Negative.   Endocrine: Negative.   Genitourinary: Negative.   Musculoskeletal: Negative.   Skin: Negative.   Allergic/Immunologic: Negative.   Neurological: Negative.   Psychiatric/Behavioral: Negative.     Exam:   BP 120/70   Pulse 68   Temp 97.7 F (36.5 C) (Skin)   Resp 16   Ht 5\' 4"  (1.626 m)   Wt 194 lb (88 kg)   LMP 12/13/1995   BMI 33.30 kg/m   Height: 5\' 4"  (162.6 cm)  General appearance: alert, cooperative and appears stated age Head: Normocephalic, without obvious abnormality, atraumatic Neck: no adenopathy, supple, symmetrical, trachea midline and thyroid normal to inspection and palpation Lungs: clear to auscultation bilaterally Breasts: normal appearance, no masses or tenderness Heart: regular rate and rhythm Abdomen: soft, non-tender; bowel sounds normal; no masses,  no organomegaly Extremities: extremities normal, atraumatic, no cyanosis or edema Skin: Skin color, texture, turgor normal. No rashes or lesions Lymph nodes: Cervical, supraclavicular, and axillary nodes normal. No abnormal inguinal nodes palpated Neurologic: Grossly normal   Pelvic: External genitalia:  no lesions              Urethra:  normal appearing urethra with no masses, tenderness or lesions              Bartholins and Skenes: normal                 Vagina: normal appearing vagina with normal color and discharge, no lesions              Cervix: no lesions              Pap taken: Yes.   Bimanual Exam:  Uterus:  normal size, contour, position, consistency, mobility, non-tender              Adnexa: normal adnexa and no mass, fullness, tenderness               Rectovaginal: Confirms               Anus:  normal sphincter tone, no lesions  Chaperone, Terence Lux, CMA, was present for exam.  A:  Well Woman with normal exam PMP, no HRT Strong family hx of breast cancer.  S/p simple mastectomy 1984.  H/o negative genetic  testing H/o depression  H/o vulvar lichen simplex chronicus  P:   Mammogram guidelines reviewed.  Doing 3D MMG. pap smear neg 2019 RF for Clobetasol 0.05% ointment.  #60gram tubes/1RF. Lab work done with Dr. Laurann Montana.   Vaccines are UTD. Return annually or prn

## 2020-04-09 ENCOUNTER — Telehealth: Payer: Self-pay

## 2020-04-09 NOTE — Telephone Encounter (Signed)
Contacted patient to discuss [place] denial letter for genetic testing. Husband stating patient was not home and he would have her contact the office when she returns home.

## 2020-04-09 NOTE — Telephone Encounter (Signed)
Patient returning our call. She will be home until about 1:30 today.

## 2020-04-10 ENCOUNTER — Other Ambulatory Visit: Payer: Self-pay

## 2020-04-10 NOTE — Telephone Encounter (Signed)
Tried to reach patient. Unable to leave a voicemail. Will try again later.

## 2020-04-13 ENCOUNTER — Ambulatory Visit (INDEPENDENT_AMBULATORY_CARE_PROVIDER_SITE_OTHER): Payer: Medicare PPO | Admitting: Obstetrics & Gynecology

## 2020-04-13 ENCOUNTER — Encounter: Payer: Self-pay | Admitting: Obstetrics & Gynecology

## 2020-04-13 ENCOUNTER — Other Ambulatory Visit: Payer: Self-pay

## 2020-04-13 VITALS — BP 120/70 | HR 68 | Temp 97.7°F | Resp 16 | Ht 64.0 in | Wt 194.0 lb

## 2020-04-13 DIAGNOSIS — Z01419 Encounter for gynecological examination (general) (routine) without abnormal findings: Secondary | ICD-10-CM | POA: Diagnosis not present

## 2020-04-13 MED ORDER — CLOBETASOL PROPIONATE 0.05 % EX OINT: TOPICAL_OINTMENT | CUTANEOUS | 1 refills | Status: DC

## 2020-04-13 NOTE — Telephone Encounter (Addendum)
Per with Dr Tat and the patient was informed at the last office visit that the test could cost up to $250. Patient was agreeable with the cost.    The last we heard the insurance was going to cover the genetic test (per the [facility] rep and their billing dept) and the patient had to pay $40 out of pocket which the patient paid.   Unsure what the insurance needs.

## 2020-04-13 NOTE — Telephone Encounter (Signed)
Have you seen this ?

## 2020-04-13 NOTE — Telephone Encounter (Signed)
Has this been followed up so encounter can be closed?

## 2020-04-13 NOTE — Telephone Encounter (Signed)
Made patient aware that she may get a bill from [facility] for her [facility] genetic testing. Patient states she did receive a bill a few months ago that she paid. She states if she receives a bill she will contact her insurance company.

## 2020-04-13 NOTE — Patient Instructions (Signed)
Dr. Georgette Shell [facility] - [facility] [place], [place] [address]   [phone])

## 2020-06-19 NOTE — Progress Notes (Signed)
Assessment/Plan:   1.  Parkinsons Disease  -[facility] genetic testing is negative.  -Continue carbidopa/levodopa 25/100, 1 tablet 3 times per day.  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  -Discussed with patient whether or not she wants an appointment with [facility] for second opinion.  Her gynecologist made note of making her an appointment with Dr. Linus Mako.  States that she was interested in research and told her to contact their research dept or clinicaltrials.gov  2.  Follow up is anticipated in the next 4-6 months, sooner should new neurologic issues arise.   Subjective:   Ashley Henderson was seen today in follow up for Parkinsons disease, which was diagnosed last visit.  My previous records were reviewed prior to todays visit as well as outside records available to me.  She isn't sure that levodopa is helping.  Thinks that tremor is a little worse. Pt denies falls.  Pt denies lightheadedness, near syncope.  No hallucinations.  Mood has been good.  Genetic testing was done last visit through [facility] and was unremarkable.  Reports she is exercising.  Thinking about starting RSB.  Noted that she saw her gynecologist on May 3 who referred her to Dr. Linus Mako according to the instructions on the notes, but I do not see that the patient actually has an appointment.  Current prescribed movement disorder medications: Carbidopa/levodopa 25/100, 1 tablet 3 times per day (started last visit)   ALLERGIES:   Allergies  Allergen Reactions  . Belviq [Lorcaserin] Other (See Comments)    drowsiness  . Other     antihistamines  . Biaxin [Clarithromycin] Rash  . Sulfa Antibiotics Rash    CURRENT MEDICATIONS:  Outpatient Encounter Medications as of 06/23/2020  Medication Sig  . buPROPion (WELLBUTRIN XL) 300 MG 24 hr tablet Take 1 tablet by mouth daily.  . carbidopa-levodopa (SINEMET IR) 25-100 MG tablet Take 1 tablet by mouth 3 (three) times daily.  .  clobetasol ointment (TEMOVATE) 0.05 % APPLY TWICE DAILY--CAN USE FOR UP TO 7 DAYs  . dicyclomine (BENTYL) 20 MG tablet Take 20 mg by mouth every 6 (six) hours. Takes as needed  . Docusate Calcium (STOOL SOFTENER PO) Take 1 tablet by mouth daily.   Marland Kitchen FIBER PO Take by mouth 2 (two) times daily.  . fluticasone (FLONASE) 50 MCG/ACT nasal spray Place 2 sprays into the nose as needed.  . gabapentin (NEURONTIN) 300 MG capsule Take 300 mg by mouth at bedtime.  Marland Kitchen levothyroxine (SYNTHROID, LEVOTHROID) 75 MCG tablet Take 1 tablet by mouth every other day.  . levothyroxine (SYNTHROID, LEVOTHROID) 88 MCG tablet Take 88 mcg by mouth every other day.   . loratadine (CLARITIN) 10 MG tablet Take 10 mg by mouth daily as needed for allergies.  . Magnesium 400 MG CAPS Take 1 capsule by mouth daily. With food  . meclizine (ANTIVERT) 12.5 MG tablet Take 12.5 mg by mouth 3 (three) times daily as needed for dizziness.  . Probiotic Product (ALIGN) 4 MG CAPS Take 1 tablet by mouth daily.   No facility-administered encounter medications on file as of 06/23/2020.    Objective:   PHYSICAL EXAMINATION:    VITALS:   Vitals:   06/23/20 1454  BP: (!) 145/75  Pulse: 71  SpO2: 99%  Weight: 191 lb (86.6 kg)  Height: 5' 3.5" (1.613 m)    GEN:  The patient appears stated age and is in NAD. HEENT:  Normocephalic, atraumatic.  The mucous membranes  are moist. The superficial temporal arteries are without ropiness or tenderness. CV:  RRR Lungs:  CTAB Neck/HEME:  There are no carotid bruits bilaterally.  Neurological examination:  Orientation: The patient is alert and oriented x3. Cranial nerves: There is good facial symmetry with facial hypomimia. The speech is fluent and clear. Soft palate rises symmetrically and there is no tongue deviation. Hearing is intact to conversational tone. Sensation: Sensation is intact to light touch throughout Motor: Strength is at least antigravity x4.  Movement examination: Tone:  There is normal tone in the UE/LE Abnormal movements: there is intermittent mild RUE resting tremor Coordination:  There is mild decremation with RAM's, with finger taps on the right Gait and Station: The patient has no difficulty arising out of a deep-seated chair without the use of the hands. The patient's stride length is good with tremor on the right.     Total time spent on today's visit was 30 minutes, including both face-to-face time and nonface-to-face time.  Time included that spent on review of records (prior notes available to me/labs/imaging if pertinent), discussing treatment and goals, answering patient's questions and coordinating care.  Cc:  Lavone Orn, MD

## 2020-06-23 ENCOUNTER — Other Ambulatory Visit: Payer: Self-pay

## 2020-06-23 ENCOUNTER — Encounter: Payer: Self-pay | Admitting: Neurology

## 2020-06-23 ENCOUNTER — Ambulatory Visit: Payer: Medicare PPO | Admitting: Neurology

## 2020-06-23 VITALS — BP 145/75 | HR 71 | Ht 63.5 in | Wt 191.0 lb

## 2020-06-23 DIAGNOSIS — G20 Parkinson's disease: Secondary | ICD-10-CM | POA: Diagnosis not present

## 2020-06-23 NOTE — Patient Instructions (Signed)
You can try looking at clinicaltrials.gov for research trials in Parkinsons disease  Parkinsons [employer]   . Local [place] Online Groups (*Please note that Corwin Levins , MSW, LCSW, has resigned her position at [facility], but will continue to lead the online groups temporarily) o Power over Parkinson's Group :   - Power Over Parkinson's Patient Education Group will be Wednesday July 14th at 2pm via [place].   - Upcoming Power over Parkinson's Meetings:  2nd Wednesdays of the month at 2 pm:       August 11th, September 8th - Amy Marriott, PT at [facility] will be resuming the lead of this group starting in July.  Contact Amy at [email or web address] if interested in participating in this online group o Parkinson's Care Partners Group:    3rd Mondays, Contact Corwin Levins o Atypical Parkinsonian Patient Group:   4th Wednesdays, Contact Corwin Levins o If you are interested in participating in these online groups with Judson Roch, please contact her directly for how to join those meetings.  Her contact information is [email or web address].  She will send you a link to join the [employer].  . [place]:  [email or web address] o PD Health at Home continues:  Mindfulness Mondays, Expert Briefing Tuesdays, Wellness Wednesdays, Take Time Thursdays, Fitness Fridays (New videos for Fitness Fridays!)   o Upcoming Webinar:  Stay tuned on their website for upcoming webinars o Please check out their website to sign up for emails and see their full online offerings  . [place]:  [email or web address]  o Upcoming Webinar:   What Comes Next?  Living with Parkinson's Years after Diagnosis.  Thursday, July 15th at 12 noon. o Upcoming Webinar:  Caregivers Matter.  Tell Congress.  Wednesday, July 28th at 3 pm o Check out additional information on their website to see their full online offerings  . [place]:   [email or web address] o Upcoming webinar:   Managing Mood and Anxiety in Parkinson's.  Tuesday, July 20th  at 2 pm. o Check out additional information to Live Well Today on their website  . Parkinson and Movement Disorders (PMD) Alliance:  www.pmdalliance.org o [employer]:  Online Education Events o Sign up for emails, which are sent weekly to give you updates on programming and online offerings  . Parkinson's Association of the [place]:  www.[email or web address] o Information on online support groups, online exercises, Parkinson's exercises and more-LOTS of information on links to PD resources and online events o Virtual Support Group through Parkinson's Association of the [place]; next one is scheduled for Wednesday, July 15, 2020 at 2 pm. (These are typically scheduled for the 1st Wednesday of the month at 2 pm).  Visit website for details. o Caring for Parkinson's, Caring for You Symposium.  Free educational event, August 22, 2020.  [place], [place].  Sign up on Parkinson's Association of the [employer].  . Additional links for movement activities: o PWR! Moves Classes at [place] are returning!  Beginning mid-July (initially, we will be offering spaces to previous participants, but if you are interested, please contact Mady Haagensen, PT) [email or web address] or [phone] o Parkinson's Wellness Recovery (PWR! Moves)  www.[email or web address] - Info on the PWR! Virtual Experience:  You will have access to our expertise through self-assessment, guided plans that start with the PD-specific fundamentals, educational content, tips, Q&A with an expert, and a growing Art therapist of PD-specific pre-recorded and live exercise classes of varying types and intensity - both physical and cognitive! If that is  not enough, we offer 1:1 wellness consultations (in-person or virtual) to personalize your PWR! Research scientist (medical).  - Check out the PWR! Move of the month  on the [place] [facility] website:  [email or web address] o [employer] Fridays:  - As part of the PD Health @ Home program, this free 10-part video series focuses each week on one aspect of fitness designed to support people living with Parkinson's.  -  [email or web address].dk o Dance for PD website is offering free, live-stream classes throughout the week, as well as links to [employer] of classes:  [email or web address] o Transport planner for Parkinson's Class:  [place].  Tuesdays 1-2 pm via Zoom.  Free offering for people with Parkinson's and care partners.  For more information, contact [phone]  email: [email or web address]@danceproject [email or web address] o Virtual dance and Pilates for Parkinson's classes: Click on the Community Tab> Parkinson's Movement Initiative Tab.  To register for classes and for more information, visit [email or web address] and click the "community" tab.  o [employer] Parkinson's Cycling Classes  - [employer]: 1pm on Fridays-Live classes at [facility] [employer] at [email or web address] or [phone]) [employer]: Virtual Classes Mondays and Thursdays (contact [place] at [email or web address] or [phone]), Zoom link is:  [employer]  [email or web address] Meeting ID: [id number] Passcode: [place] are offered Tuesdays and Thursdays at 12 p.m. & 1:45 pm at [employer]. To observe a class or for  more information, call [phone], visit [email or web address] or [email or web address].

## 2020-09-06 ENCOUNTER — Other Ambulatory Visit: Payer: Self-pay | Admitting: Neurology

## 2020-10-26 ENCOUNTER — Encounter: Payer: Self-pay | Admitting: Obstetrics & Gynecology

## 2020-12-24 NOTE — Progress Notes (Signed)
Virtual Visit Via Video   The purpose of this virtual visit is to provide medical care while limiting exposure to the novel coronavirus.    Consent was obtained for video visit:  Yes.   Answered questions that patient had about telehealth interaction:  Yes.   I discussed the limitations, risks, security and privacy concerns of performing an evaluation and management service by telemedicine. I also discussed with the patient that there may be a patient responsible charge related to this service. The patient expressed understanding and agreed to proceed.  Pt location: Home Physician Location: office Name of referring provider:  Lavone Orn, MD I connected with Ashley Henderson at patients initiation/request on 12/29/2020 at  9:45 AM EST by video enabled telemedicine application and verified that I am speaking with the correct person using two identifiers. Pt MRN:  [id number] Pt DOB:  [birth year] Video Participants:  Ashley Henderson;    Assessment/Plan:   1.  Parkinson's disease  -Discussed the importance of taking her levodopa regular and at scheduled times such as carbidopa/levodopa 25/100, 1 tablet at 8 AM/noon/4 PM.  Discussed that her middle of the night dose (she wakes up to take it) is likely of no use.  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  -Discussed other treatment options in the future.  -Has had [facility] genetic testing which was negative.  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  R/B/SE were discussed.  The opportunity to ask questions was given and they were answered to the best of my ability.  The patient expressed understanding and willingness to follow the outlined treatment protocols.  -If above with side effects or not effective, discussed with patient that we can add CR levodopa at bedtime or even low-dose clonazepam.  Subjective   Patient seen today in follow-up for Parkinson's disease. She  denies SE with the medication.  She asks about potentially taking a different drug.   Pt denies falls.  Pt denies lightheadedness, near syncope.  No hallucinations.  Mood has been good.  She is not exercising as much as she was.  She is complaining about trouble sleeping because of restlessness in the right arm.  States that she has had this for many years and primary care prescribes gabapentin, 300 mg at night.  Asked primary care if she could increase it, but was told to ask me.  Current movement d/o meds:  Carbidopa/levodopa 25/100, 1 tablet 3 times per day (admits has trouble getting then 2nd one on a schedule and then takes the 3rd at midnight)   Current Outpatient Medications on File Prior to Visit  Medication Sig Dispense Refill  . buPROPion (WELLBUTRIN XL) 300 MG 24 hr tablet Take 1 tablet by mouth daily.    . carbidopa-levodopa (SINEMET IR) 25-100 MG tablet TAKE ONE TABLET THREE TIMES DAILY 270 tablet 1  . clobetasol ointment (TEMOVATE) 0.05 % APPLY TWICE DAILY--CAN USE FOR UP TO 7 DAYs 60 g 1  . dicyclomine (BENTYL) 20 MG tablet Take 20 mg by mouth every 6 (six) hours. Takes as needed    . Docusate Calcium (STOOL SOFTENER PO) Take 1 tablet by mouth daily.     Marland Kitchen FIBER PO Take by mouth 2 (two) times daily.    . fluticasone (FLONASE) 50 MCG/ACT nasal spray Place 2 sprays into the nose as needed.    . gabapentin (NEURONTIN) 300 MG capsule Take 300 mg by mouth at bedtime.    Marland Kitchen  levothyroxine (SYNTHROID, LEVOTHROID) 75 MCG tablet Take 1 tablet by mouth every other day.    . levothyroxine (SYNTHROID, LEVOTHROID) 88 MCG tablet Take 88 mcg by mouth every other day.     . loratadine (CLARITIN) 10 MG tablet Take 10 mg by mouth daily as needed for allergies.    . Magnesium 400 MG CAPS Take 1 capsule by mouth daily. With food    . meclizine (ANTIVERT) 12.5 MG tablet Take 12.5 mg by mouth 3 (three) times daily as needed for dizziness.    . Probiotic Product (ALIGN) 4 MG CAPS Take 1 tablet by mouth  daily.     No current facility-administered medications on file prior to visit.     Objective   There were no vitals filed for this visit. GEN:  The patient appears stated age and is in NAD.  Neurological examination:  Orientation: The patient is alert and oriented x3. Cranial nerves: There is good facial symmetry. There is no signif facial hypomimia.  The speech is fluent and clear. Soft palate rises symmetrically and there is no tongue deviation. Hearing is intact to conversational tone. Motor: Strength is at least antigravity x 4.   Shoulder shrug is equal and symmetric.  There is no pronator drift.  Movement examination: Tone: unable Abnormal movements: There is mild right upper extremity rest tremor, most significant with ambulation. Coordination:  There is mild decremation with RAM's, with hand opening and closing, finger taps and the action of "turning in a light bulb" on the right only. Gait and Station: The patient has no difficulty arising out of a deep-seated chair without the use of the hands. The patient's stride length is good.      Follow up Instructions      -I discussed the assessment and treatment plan with the patient. The patient was provided an opportunity to ask questions and all were answered. The patient agreed with the plan and demonstrated an understanding of the instructions.   The patient was advised to call back or seek an in-person evaluation if the symptoms worsen or if the condition fails to improve as anticipated.    Total time spent on today's visit was 30 minutes, including both face-to-face time and nonface-to-face time.  Time included that spent on review of records (prior notes available to me/labs/imaging if pertinent), discussing treatment and goals, answering patient's questions and coordinating care.   Alonza Bogus, DO

## 2020-12-29 ENCOUNTER — Encounter: Payer: Self-pay | Admitting: Neurology

## 2020-12-29 ENCOUNTER — Telehealth (INDEPENDENT_AMBULATORY_CARE_PROVIDER_SITE_OTHER): Payer: Medicare PPO | Admitting: Neurology

## 2020-12-29 DIAGNOSIS — G2581 Restless legs syndrome: Secondary | ICD-10-CM | POA: Diagnosis not present

## 2020-12-29 DIAGNOSIS — G20 Parkinson's disease: Secondary | ICD-10-CM

## 2020-12-29 MED ORDER — GABAPENTIN 600 MG PO TABS: 600.0000 mg | ORAL_TABLET | Freq: Every day | ORAL | 1 refills | Status: DC

## 2021-02-01 DIAGNOSIS — L649 Androgenic alopecia, unspecified: Secondary | ICD-10-CM | POA: Diagnosis not present

## 2021-02-01 DIAGNOSIS — L298 Other pruritus: Secondary | ICD-10-CM | POA: Diagnosis not present

## 2021-02-01 DIAGNOSIS — D1801 Hemangioma of skin and subcutaneous tissue: Secondary | ICD-10-CM | POA: Diagnosis not present

## 2021-02-01 DIAGNOSIS — Z85828 Personal history of other malignant neoplasm of skin: Secondary | ICD-10-CM | POA: Diagnosis not present

## 2021-02-01 DIAGNOSIS — L738 Other specified follicular disorders: Secondary | ICD-10-CM | POA: Diagnosis not present

## 2021-02-01 DIAGNOSIS — L820 Inflamed seborrheic keratosis: Secondary | ICD-10-CM | POA: Diagnosis not present

## 2021-02-01 DIAGNOSIS — L821 Other seborrheic keratosis: Secondary | ICD-10-CM | POA: Diagnosis not present

## 2021-03-18 DIAGNOSIS — M545 Low back pain, unspecified: Secondary | ICD-10-CM | POA: Diagnosis not present

## 2021-03-18 DIAGNOSIS — M25552 Pain in left hip: Secondary | ICD-10-CM | POA: Diagnosis not present

## 2021-03-23 ENCOUNTER — Other Ambulatory Visit: Payer: Self-pay | Admitting: Neurology

## 2021-03-23 NOTE — Telephone Encounter (Signed)
Rx(s) sent to pharmacy electronically.  

## 2021-04-14 ENCOUNTER — Other Ambulatory Visit: Payer: Self-pay | Admitting: Orthopedic Surgery

## 2021-04-14 DIAGNOSIS — M25552 Pain in left hip: Secondary | ICD-10-CM

## 2021-04-14 DIAGNOSIS — M545 Low back pain, unspecified: Secondary | ICD-10-CM

## 2021-04-15 DIAGNOSIS — H2513 Age-related nuclear cataract, bilateral: Secondary | ICD-10-CM | POA: Diagnosis not present

## 2021-04-15 DIAGNOSIS — H524 Presbyopia: Secondary | ICD-10-CM | POA: Diagnosis not present

## 2021-04-25 ENCOUNTER — Other Ambulatory Visit: Payer: Self-pay

## 2021-04-25 ENCOUNTER — Ambulatory Visit
Admission: RE | Admit: 2021-04-25 | Discharge: 2021-04-25 | Disposition: A | Discharge status: Home / Self Care | Payer: Medicare PPO | Source: Ambulatory Visit | Attending: Orthopedic Surgery | Admitting: Orthopedic Surgery

## 2021-04-25 DIAGNOSIS — S76312A Strain of muscle, fascia and tendon of the posterior muscle group at thigh level, left thigh, initial encounter: Secondary | ICD-10-CM | POA: Diagnosis not present

## 2021-04-25 DIAGNOSIS — M533 Sacrococcygeal disorders, not elsewhere classified: Secondary | ICD-10-CM | POA: Diagnosis not present

## 2021-04-25 DIAGNOSIS — D251 Intramural leiomyoma of uterus: Secondary | ICD-10-CM | POA: Diagnosis not present

## 2021-04-25 DIAGNOSIS — M545 Low back pain, unspecified: Secondary | ICD-10-CM | POA: Diagnosis not present

## 2021-04-25 DIAGNOSIS — M25552 Pain in left hip: Secondary | ICD-10-CM

## 2021-04-25 DIAGNOSIS — G8929 Other chronic pain: Secondary | ICD-10-CM | POA: Diagnosis not present

## 2021-05-06 DIAGNOSIS — M25552 Pain in left hip: Secondary | ICD-10-CM | POA: Diagnosis not present

## 2021-05-12 DIAGNOSIS — M25552 Pain in left hip: Secondary | ICD-10-CM | POA: Diagnosis not present

## 2021-05-13 ENCOUNTER — Ambulatory Visit: Payer: Medicare PPO | Admitting: Obstetrics and Gynecology

## 2021-06-01 DIAGNOSIS — M5416 Radiculopathy, lumbar region: Secondary | ICD-10-CM | POA: Diagnosis not present

## 2021-06-01 DIAGNOSIS — M25552 Pain in left hip: Secondary | ICD-10-CM | POA: Diagnosis not present

## 2021-06-04 DIAGNOSIS — M5416 Radiculopathy, lumbar region: Secondary | ICD-10-CM | POA: Diagnosis not present

## 2021-06-04 NOTE — Progress Notes (Signed)
Assessment/Plan:   1.  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  2.  Restless leg/arm syndrome  -Continue gabapentin, 600 mg at bedtime.  May need to add CR levodopa q hs if it becomes more problematic in the future.  She is finding that just using a weight/lifting a weight will help it.    Subjective:   Ashley Henderson was seen today in follow up for Parkinsons disease.  My previous records were reviewed prior to todays visit as well as outside records available to me. Pt denies falls but a few "close calls."  Pt denies lightheadedness, near syncope.  No hallucinations.  Mood has been good.  Having an injection in the hip 3 weeks ago and had the back done this past Friday at [facility].    Current prescribed movement disorder medications: Carbidopa/levodopa 25/100, 1 tablet 3 times per day (told her to take it at 8 AM/noon/4 PM -previously waking up in the middle of the night to take it) Gabapentin, 600 mg at bedtime (increased last visit for restless arm)   ALLERGIES:   Allergies  Allergen Reactions   Belviq [Lorcaserin] Other (See Comments)    drowsiness   Other     antihistamines   Biaxin [Clarithromycin] Rash   Sulfa Antibiotics Rash    CURRENT MEDICATIONS:  Outpatient Encounter Medications as of 06/07/2021  Medication Sig   buPROPion (WELLBUTRIN XL) 300 MG 24 hr tablet Take 1 tablet by mouth daily.   carbidopa-levodopa (SINEMET IR) 25-100 MG tablet TAKE ONE TABLET THREE TIMES DAILY   clobetasol ointment (TEMOVATE) 0.05 % APPLY TWICE DAILY--CAN USE FOR UP TO 7 DAYs   dicyclomine (BENTYL) 20 MG tablet Take 20 mg by mouth every 6 (six) hours. Takes as needed   Docusate Calcium (STOOL SOFTENER PO) Take 1 tablet by mouth daily.    FIBER PO Take by mouth 2 (two) times daily.   fluticasone (FLONASE) 50 MCG/ACT nasal spray Place 2 sprays into  the nose as needed.   gabapentin (NEURONTIN) 600 MG tablet Take 1 tablet (600 mg total) by mouth at bedtime.   levothyroxine (SYNTHROID, LEVOTHROID) 75 MCG tablet Take 1 tablet by mouth every other day.   levothyroxine (SYNTHROID, LEVOTHROID) 88 MCG tablet Take 88 mcg by mouth every other day.    loratadine (CLARITIN) 10 MG tablet Take 10 mg by mouth daily as needed for allergies.   Magnesium 400 MG CAPS Take 1 capsule by mouth daily. With food   meclizine (ANTIVERT) 12.5 MG tablet Take 12.5 mg by mouth 3 (three) times daily as needed for dizziness.   Probiotic Product (ALIGN) 4 MG CAPS Take 1 tablet by mouth daily.   No facility-administered encounter medications on file as of 06/07/2021.    Objective:   PHYSICAL EXAMINATION:    VITALS:  There were no vitals filed for this visit.  GEN:  The patient appears stated age and is in NAD. HEENT:  Normocephalic, atraumatic.  The mucous membranes are moist. The superficial temporal arteries are without ropiness or tenderness. CV:  RRR Lungs:  CTAB Neck/HEME:  There are no carotid bruits bilaterally.  Neurological examination:  Orientation: The patient is alert and oriented x3. Cranial nerves: There is good facial symmetry without facial hypomimia. The speech is fluent and clear. Soft palate rises symmetrically and there is no tongue deviation. Hearing is intact to conversational tone.  Sensation: Sensation is intact to light touch throughout Motor: Strength is at least antigravity x4.  Movement examination: Tone: There is nl tone in the UE/LE Abnormal movements: there is RUE rest tremor only with ambulation Coordination:  There is mild decremation with RAM's, with toe taps on the right Gait and Station: The patient has no difficulty arising out of a deep-seated chair without the use of the hands. The patient's stride length is good.    I have reviewed and interpreted the following labs independently    Chemistry      Component Value  Date/Time   NA 137 12/23/2015 1430   K 4.2 12/23/2015 1430   CL 99 12/23/2015 1430   CO2 30 12/23/2015 1430   BUN 12 12/23/2015 1430   CREATININE 0.71 12/23/2015 1430      Component Value Date/Time   CALCIUM 9.7 12/23/2015 1430   ALKPHOS 71 12/23/2015 1430   AST 17 12/23/2015 1430   ALT 6 12/23/2015 1430   BILITOT 1.1 12/23/2015 1430       Lab Results  Component Value Date   WBC 6.7 12/23/2015   HGB 14.9 12/23/2015   HCT 45.0 12/23/2015   MCV 94.7 12/23/2015   PLT 325.0 12/23/2015    Lab Results  Component Value Date   TSH 7.92 (H) 12/23/2015     Total time spent on today's visit was 20 minutes, including both face-to-face time and nonface-to-face time.  Time included that spent on review of records (prior notes available to me/labs/imaging if pertinent), discussing treatment and goals, answering patient's questions and coordinating care.  Cc:  Lavone Orn, MD

## 2021-06-07 ENCOUNTER — Other Ambulatory Visit: Payer: Self-pay

## 2021-06-07 ENCOUNTER — Encounter: Payer: Self-pay | Admitting: Neurology

## 2021-06-07 ENCOUNTER — Ambulatory Visit: Payer: Medicare PPO | Admitting: Neurology

## 2021-06-07 VITALS — BP 112/78 | HR 91 | Ht 63.0 in | Wt 188.0 lb

## 2021-06-07 DIAGNOSIS — G20 Parkinson's disease: Secondary | ICD-10-CM | POA: Diagnosis not present

## 2021-06-07 DIAGNOSIS — G2581 Restless legs syndrome: Secondary | ICD-10-CM | POA: Diagnosis not present

## 2021-06-07 MED ORDER — GABAPENTIN 600 MG PO TABS: 600.0000 mg | ORAL_TABLET | Freq: Every day | ORAL | 1 refills | Status: DC

## 2021-06-07 MED ORDER — CARBIDOPA-LEVODOPA 25-100 MG PO TABS: 1.0000 | ORAL_TABLET | Freq: Three times a day (TID) | ORAL | 1 refills | Status: DC

## 2021-06-07 NOTE — Patient Instructions (Signed)
Online Resources for Power over Parkinson's Group May 2022  Local [place] Online Groups  Power over Parkinson's Group :    Upcoming Power over Parkinson's Meetings:  2nd Wednesdays of the month at 2 pm:  June 8th, July 13th [place] at [email or web address] if interested in participating in this group Parkinson's Care Partners Group:    3rd Mondays, Contact Misty Paladino Atypical Parkinsonian Patient Group:   4th Wednesdays, Contact Misty Paladino If you are interested in participating in these groups with Misty, please contact her directly for how to join those meetings.  Her contact information is [email or web address].   [place]:  [email or web address] PD Health at Home continues:  Mindfulness Mondays, Expert Briefing Tuesdays, Wellness Wednesdays, Take Time Thursdays, Fitness Fridays Register for expert briefings (webinars) at [email or web address]  Please check out their website to sign up for emails and see their full online offerings  [place]:  [email or web address]  Check out additional information on their website to see their full online offerings  [facility]:  [email or web address] Upcoming Webinar:  Stay tuned Care Partner Monthly Meetup.  With [facility] [place].  First Tuesday of each month, 2 pm Joy Breaks:  First Wednesday of each month, 2-3 pm. There will be art, doodling, making, crafting, listening, laughing, stories, and everything in between. No art experience necessary. No supplies required. Just show up for joy!  Register on their website. Check out additional information to Live Well Today on their website  Parkinson and Movement Disorders (PMD) Alliance:  www.[email or web address] [employer] Online:  Online Education Events Sign up for emails, which are sent weekly to give you updates on programming and online offerings     Parkinson's Association of the [place]:   www.[email or web address] Information on online support groups, education events, and online exercises including Yoga, Parkinson's exercises and more-LOTS of information on links to PD resources and online events Virtual Support Group through Parkinson's Association of the [place]; next one is scheduled for Wednesday, May 4th, 2022 at 2 pm. (These are typically scheduled for the 1st Wednesday of the month at 2 pm).  Visit website for details.  Additional links for movement activities: PWR! Moves Classes at [place] RESUMED!  Wednesdays 10 and 11 am.  Contact Amy Marriott, PT [email or web address] or [phone] if interested Here is a link to the PWR!Moves classes on Zoom from [phone] - Daily Mon-Sat at 10:00. Via Zoom, FREE and open to all.  There is also a link below via Facebook if you use that platform. [place] [email or web address] Parkinson's Wellness Recovery (PWR! Moves)  www.[email or web address] Info on the PWR! Virtual Experience:  You will have access to our expertise through self-assessment, guided plans that start with the PD-specific fundamentals, educational content, tips, Q&A with an expert, and a growing PrepaidParty.no of PD-specific pre-recorded and live exercise classes of varying types and intensity - both physical and cognitive! If that is not enough, we offer 1:1 wellness consultations (in-person or virtual) to personalize your PWR! Art therapist.  Check out the PWR! Move of the month on the [place] Recovery website:  [address] [email or web address] Fridays:  As part of the PD Health @ Home program, this free video series focuses each week on one aspect of fitness designed to support  people living with Parkinson's.  These weekly videos highlight the [place] recent fitness guidelines for people with Parkinson's disease.  [address] Dance for PD website is offering free, live-stream classes throughout the week, as well as links  to [employer] of classes:  [email or web address] Dance for Parkinson's Class:  [place].  Free offering for people with Parkinson's and care partners; virtual class.  For more information, contact [phone] or email Ruffin Frederick at [email or web address] Virtual dance and Pilates for Parkinson's classes: Click on the Community Tab> Parkinson's Movement Initiative Tab.  To register for classes and for more information, visit [email or web address] and click the "community" tab.     [employer] Parkinson's Cycling Classes  [facility]: 1pm on Fridays-Live classes at [employer] ([employer] at [email or web address] or [phone]) [facility] [employer]: Virtual Classes Mondays and Thursdays Jeanette Caprice classes Tuesday, Wednesday and Thursday (contact [place] at [email or web address]  or [phone])  [facility] Boxing Three levels of classes are offered Tuesdays and Thursdays:  10:30 am,  12 noon & 1:45 pm at [facility].  Active Stretching with Paula Compton Class starting in March, on Fridays To observe a class or for  more information, call [phone] or email [email or web address] [employer]: Online Caregiver Education Opportunities:  [email or web address]/caregiver-education/[email or web address]-support-group.  You may also contact Vickki Muff at [email or web address] or [phone].   Well-Spring Navigator:  [phone] program, a free service to help individuals and families through the journey of determining care for older adults.  The "Navigator" is a  [employer], Education officer, museum, who will speak with a prospective client and/or loved ones to provide an assessment of the situation and a set of recommendations for a personalized care plan -- all free of charge, and whether Well[employer]Spring [employer] offers the needed service or not. If the need is not a service we provide, we are well-connected with reputable programs in town that we can refer you to.  www.well-[email or web address] or to speak with the Navigator, call [phone].

## 2021-06-09 ENCOUNTER — Ambulatory Visit (INDEPENDENT_AMBULATORY_CARE_PROVIDER_SITE_OTHER): Payer: Medicare PPO | Admitting: Obstetrics & Gynecology

## 2021-06-09 ENCOUNTER — Other Ambulatory Visit (HOSPITAL_COMMUNITY): Payer: Self-pay

## 2021-06-09 ENCOUNTER — Other Ambulatory Visit (HOSPITAL_BASED_OUTPATIENT_CLINIC_OR_DEPARTMENT_OTHER): Payer: Self-pay

## 2021-06-09 ENCOUNTER — Encounter (HOSPITAL_BASED_OUTPATIENT_CLINIC_OR_DEPARTMENT_OTHER): Payer: Self-pay | Admitting: Obstetrics & Gynecology

## 2021-06-09 ENCOUNTER — Other Ambulatory Visit: Payer: Self-pay

## 2021-06-09 VITALS — BP 106/62 | HR 72 | Ht 63.0 in | Wt 187.2 lb

## 2021-06-09 DIAGNOSIS — Z803 Family history of malignant neoplasm of breast: Secondary | ICD-10-CM | POA: Diagnosis not present

## 2021-06-09 DIAGNOSIS — G20 Parkinson's disease: Secondary | ICD-10-CM | POA: Diagnosis not present

## 2021-06-09 DIAGNOSIS — L280 Lichen simplex chronicus: Secondary | ICD-10-CM | POA: Diagnosis not present

## 2021-06-09 DIAGNOSIS — A6000 Herpesviral infection of urogenital system, unspecified: Secondary | ICD-10-CM

## 2021-06-09 DIAGNOSIS — Z1211 Encounter for screening for malignant neoplasm of colon: Secondary | ICD-10-CM

## 2021-06-09 DIAGNOSIS — Z9189 Other specified personal risk factors, not elsewhere classified: Secondary | ICD-10-CM

## 2021-06-09 DIAGNOSIS — Z780 Asymptomatic menopausal state: Secondary | ICD-10-CM

## 2021-06-09 MED ORDER — CLOBETASOL PROPIONATE 0.05 % EX OINT: TOPICAL_OINTMENT | CUTANEOUS | 1 refills | Status: DC

## 2021-06-09 MED ORDER — VALACYCLOVIR HCL 500 MG PO TABS
500.0000 mg | ORAL_TABLET | Freq: Two times a day (BID) | ORAL | 1 refills | Status: DC
  Filled 2021-06-09 (×2): qty 6, 3d supply, fill #0
  Filled 2021-06-09: qty 12, 6d supply, fill #0
  Filled 2021-06-09: qty 6, 3d supply, fill #0

## 2021-06-09 NOTE — Progress Notes (Signed)
80 y.o. G0P0000 Married White or Caucasian female here for breast and pelvic exam.  I am following her due to increased risk of breast cancer.  Denies any breast complaints.  Denies vaginal bleeding.  Needs new therapist.  Options discussed.  Just saw Dr. Carles Collet for Parkinson's follow up.  Pt reports she does feels clumsy at times.  Has Parkinson's.  On Sinemet.  Dosage stage.  Also takes Gabapentin for restless leg/arm symptoms.  This is related to the Parkinson's.   Patient's last menstrual period was 12/13/1995.          H/O STD:  no  Health Maintenance: PCP:  Dr. Laurann Montana.  Last wellness appt was 07/2020.  Did blood work at that appt:  yes Vaccines are up to date:  yes Colonoscopy:  09/2013, 7 year follow up. MMG:  09/2020 BMD:  2020, normal Last pap smear:  2019   H/o abnormal pap smear:  no   reports that she has never smoked. She has never used smokeless tobacco. She reports previous alcohol use. She reports that she does not use drugs.  Past Medical History:  Diagnosis Date   Abnormal uterine bleeding (AUB)    endo biopsy negative   ADD (attention deficit disorder)    Allergic rhinitis    Anxiety    Bouchard nodes (DJD hand)    Claustrophobia    Colon polyp    Cystic acne    Depression    Diverticulosis    Glaucoma    Goiter    Hematuria 1994   negative cysto, IVP   Hemorrhoids    Hyperlipidemia    Hyperplastic colon polyp    Hypothyroidism    IBS (irritable bowel syndrome)    using align-saw Dr Olevia Perches in 2014   Obesity    Parkinson disease ([place])    Plantar fasciitis    Restless leg syndrome    Vertigo     Past Surgical History:  Procedure Laterality Date   breast implants replaced  6/08   GLAUCOMA SURGERY     bilateral laser   SHOULDER SURGERY  3/03, 9/01   bilateral   SIMPLE MASTECTOMY  1984   bilateral with implants (prophylactic mastectomy)    Current Outpatient Medications  Medication Sig Dispense Refill   buPROPion (WELLBUTRIN XL) 300 MG 24 hr  tablet Take 1 tablet by mouth daily.     carbidopa-levodopa (SINEMET IR) 25-100 MG tablet Take 1 tablet by mouth 3 (three) times daily. 270 tablet 1   clobetasol ointment (TEMOVATE) 0.05 % APPLY TWICE DAILY--CAN USE FOR UP TO 7 DAYs 60 g 1   dicyclomine (BENTYL) 20 MG tablet Take 20 mg by mouth every 6 (six) hours. Takes as needed     Docusate Calcium (STOOL SOFTENER PO) Take 1 tablet by mouth daily.      FIBER PO Take by mouth 2 (two) times daily.     fluticasone (FLONASE) 50 MCG/ACT nasal spray Place 2 sprays into the nose as needed.     gabapentin (NEURONTIN) 600 MG tablet Take 1 tablet (600 mg total) by mouth at bedtime. 90 tablet 1   levothyroxine (SYNTHROID, LEVOTHROID) 75 MCG tablet Take 1 tablet by mouth every other day.     levothyroxine (SYNTHROID, LEVOTHROID) 88 MCG tablet Take 88 mcg by mouth every other day.      loratadine (CLARITIN) 10 MG tablet Take 10 mg by mouth daily as needed for allergies.     Magnesium 400 MG CAPS Take 1 capsule by  mouth daily. With food     meclizine (ANTIVERT) 12.5 MG tablet Take 12.5 mg by mouth 3 (three) times daily as needed for dizziness.     Probiotic Product (ALIGN) 4 MG CAPS Take 1 tablet by mouth daily.     No current facility-administered medications for this visit.    Family History  Problem Relation Age of Onset   Breast cancer Mother    Bone cancer Mother    Diabetes Mother    Breast cancer Maternal Grandmother    Breast cancer Maternal Aunt    Breast cancer Paternal Aunt    Cancer Paternal Uncle        ?, x 2 uncles   Heart attack Father    Parkinson's disease Father    Alzheimer's disease Father    CAD Brother    Hypertension Brother    Colon cancer Neg Hx     Review of Systems  Constitutional: Negative.   Genitourinary: Negative.    Exam:   BP 106/62 (BP Location: Left Arm, Patient Position: Sitting, Cuff Size: Large)   Pulse 72   Ht 5\' 3"  (1.6 m) Comment: reported  Wt 187 lb 3.2 oz (84.9 kg)   LMP 12/13/1995   BMI  33.16 kg/m   Height: 5\' 3"  (160 cm) (reported)  General appearance: alert, cooperative and appears stated age Breasts: normal appearance, no masses or tenderness, well healed scars present Abdomen: soft, non-tender; bowel sounds normal; no masses,  no organomegaly Lymph nodes: Cervical, supraclavicular, and axillary nodes normal.  No abnormal inguinal nodes palpated Neurologic: Grossly normal  Pelvic: External genitalia:  no lesions              Urethra:  normal appearing urethra with no masses, tenderness or lesions              Bartholins and Skenes: normal                 Vagina: normal appearing vagina with atrophic changes and no discharge, no lesions              Cervix: no lesions              Pap taken: No. Bimanual Exam:  Uterus:  normal size, contour, position, consistency, mobility, non-tender              Adnexa: normal adnexa and no mass, fullness, tenderness               Rectovaginal: Confirms               Anus:  normal sphincter tone, no lesions  Chaperone, Octaviano Batty, CMA, was present for exam.  Assessment/Plan: 1. GYN exam for high-risk Medicare patient - Pap smears not indicated now due to age.  Normal 2019. - MMG 09/2020 - colonoscopy 2014.  Follow up due.  Needs referral - BMD 2020, normal - Lab work done with Dr. Laurann Montana - vaccines updated  2. Postmenopausal - no HRT  3. Colon cancer screening - Ambulatory referral to Gastroenterology  4. Lichen simplex chronicus - clobetasol ointment (TEMOVATE) 0.05 %; APPLY TWICE DAILY--CAN USE FOR UP TO 7 DAYs  Dispense: 60 g; Refill: 1  5. Parkinson's disease ([place]) - followed by Dr. Carles Collet  6. Genital herpes simplex, unspecified site - valACYclovir (VALTREX) 500 MG tablet; Take 1 tablet (500 mg total) by mouth 2 (two) times daily. Take one tablet twice daily at onset of symptoms for 3 days.  Dispense: 6 tablet; Refill:  1  7. Family history of breast cancer - h/o bilateral simplex mastectomy in 1984 - h/o  negative genetic testing

## 2021-06-12 ENCOUNTER — Encounter (HOSPITAL_BASED_OUTPATIENT_CLINIC_OR_DEPARTMENT_OTHER): Payer: Self-pay | Admitting: Obstetrics & Gynecology

## 2021-06-12 DIAGNOSIS — Z803 Family history of malignant neoplasm of breast: Secondary | ICD-10-CM | POA: Insufficient documentation

## 2021-06-12 DIAGNOSIS — Z780 Asymptomatic menopausal state: Secondary | ICD-10-CM | POA: Insufficient documentation

## 2021-06-15 ENCOUNTER — Ambulatory Visit: Payer: Medicare PPO

## 2021-07-05 DIAGNOSIS — M5416 Radiculopathy, lumbar region: Secondary | ICD-10-CM | POA: Diagnosis not present

## 2021-07-14 DIAGNOSIS — M5416 Radiculopathy, lumbar region: Secondary | ICD-10-CM | POA: Diagnosis not present

## 2021-08-05 DIAGNOSIS — M5416 Radiculopathy, lumbar region: Secondary | ICD-10-CM | POA: Diagnosis not present

## 2021-08-18 DIAGNOSIS — L245 Irritant contact dermatitis due to other chemical products: Secondary | ICD-10-CM | POA: Diagnosis not present

## 2021-08-18 DIAGNOSIS — Z85828 Personal history of other malignant neoplasm of skin: Secondary | ICD-10-CM | POA: Diagnosis not present

## 2021-10-04 DIAGNOSIS — Z1231 Encounter for screening mammogram for malignant neoplasm of breast: Secondary | ICD-10-CM | POA: Diagnosis not present

## 2021-10-06 ENCOUNTER — Encounter (HOSPITAL_BASED_OUTPATIENT_CLINIC_OR_DEPARTMENT_OTHER): Payer: Self-pay | Admitting: *Deleted

## 2021-10-06 DIAGNOSIS — L300 Nummular dermatitis: Secondary | ICD-10-CM | POA: Diagnosis not present

## 2021-10-06 DIAGNOSIS — L245 Irritant contact dermatitis due to other chemical products: Secondary | ICD-10-CM | POA: Diagnosis not present

## 2021-10-06 DIAGNOSIS — Z85828 Personal history of other malignant neoplasm of skin: Secondary | ICD-10-CM | POA: Diagnosis not present

## 2021-10-21 DIAGNOSIS — F3342 Major depressive disorder, recurrent, in full remission: Secondary | ICD-10-CM | POA: Diagnosis not present

## 2021-10-21 DIAGNOSIS — R21 Rash and other nonspecific skin eruption: Secondary | ICD-10-CM | POA: Diagnosis not present

## 2021-10-21 DIAGNOSIS — E782 Mixed hyperlipidemia: Secondary | ICD-10-CM | POA: Diagnosis not present

## 2021-10-21 DIAGNOSIS — Z23 Encounter for immunization: Secondary | ICD-10-CM | POA: Diagnosis not present

## 2021-10-21 DIAGNOSIS — Z1389 Encounter for screening for other disorder: Secondary | ICD-10-CM | POA: Diagnosis not present

## 2021-10-21 DIAGNOSIS — E049 Nontoxic goiter, unspecified: Secondary | ICD-10-CM | POA: Diagnosis not present

## 2021-10-21 DIAGNOSIS — G20 Parkinson's disease: Secondary | ICD-10-CM | POA: Diagnosis not present

## 2021-10-21 DIAGNOSIS — G2581 Restless legs syndrome: Secondary | ICD-10-CM | POA: Diagnosis not present

## 2021-10-21 DIAGNOSIS — Z0000 Encounter for general adult medical examination without abnormal findings: Secondary | ICD-10-CM | POA: Diagnosis not present

## 2021-10-29 DIAGNOSIS — Z1211 Encounter for screening for malignant neoplasm of colon: Secondary | ICD-10-CM | POA: Diagnosis not present

## 2021-11-01 DIAGNOSIS — L298 Other pruritus: Secondary | ICD-10-CM | POA: Diagnosis not present

## 2021-11-01 DIAGNOSIS — Z85828 Personal history of other malignant neoplasm of skin: Secondary | ICD-10-CM | POA: Diagnosis not present

## 2021-11-01 DIAGNOSIS — L309 Dermatitis, unspecified: Secondary | ICD-10-CM | POA: Diagnosis not present

## 2021-12-18 ENCOUNTER — Other Ambulatory Visit: Payer: Self-pay | Admitting: Neurology

## 2021-12-18 DIAGNOSIS — G20 Parkinson's disease: Secondary | ICD-10-CM

## 2021-12-20 ENCOUNTER — Telehealth (HOSPITAL_BASED_OUTPATIENT_CLINIC_OR_DEPARTMENT_OTHER): Payer: Self-pay | Admitting: Obstetrics & Gynecology

## 2021-12-20 ENCOUNTER — Other Ambulatory Visit: Payer: Self-pay

## 2021-12-20 NOTE — Telephone Encounter (Signed)
Patient called and would like a list psychologist .

## 2021-12-22 NOTE — Progress Notes (Deleted)
Assessment/Plan:   1.  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  2.  Restless leg/arm syndrome  -Continue gabapentin, 600 mg at bedtime.  May need to add CR levodopa q hs if it becomes more problematic in the future.  She is finding that just using a weight/lifting a weight will help it.    Subjective:   Ashley Henderson was seen today in follow up for Parkinsons disease.  My previous records were reviewed prior to todays visit as well as outside records available to me.Pt denies falls.  Pt denies lightheadedness, near syncope.  No hallucinations.  Mood has been good.  Current prescribed movement disorder medications: Carbidopa/levodopa 25/100, 1 tablet 3 times per day (told her to take it at 8 AM/noon/4 PM -previously waking up in the middle of the night to take it) Gabapentin, 600 mg at bedtime    ALLERGIES:   Allergies  Allergen Reactions   Belviq [Lorcaserin] Other (See Comments)    drowsiness   Other     antihistamines   Biaxin [Clarithromycin] Rash   Sulfa Antibiotics Rash    CURRENT MEDICATIONS:  Outpatient Encounter Medications as of 12/24/2021  Medication Sig   buPROPion (WELLBUTRIN XL) 300 MG 24 hr tablet Take 1 tablet by mouth daily.   carbidopa-levodopa (SINEMET IR) 25-100 MG tablet TAKE ONE TABLET THREE TIMES DAILY (8AM/NOON/4PM)   clobetasol ointment (TEMOVATE) 0.05 % APPLY TWICE DAILY--CAN USE FOR UP TO 7 DAYs   dicyclomine (BENTYL) 20 MG tablet Take 20 mg by mouth every 6 (six) hours. Takes as needed   Docusate Calcium (STOOL SOFTENER PO) Take 1 tablet by mouth daily.    FIBER PO Take by mouth 2 (two) times daily.   fluticasone (FLONASE) 50 MCG/ACT nasal spray Place 2 sprays into the nose as needed.   gabapentin (NEURONTIN) 600 MG tablet Take 1 tablet (600 mg total) by mouth at bedtime.   levothyroxine (SYNTHROID,  LEVOTHROID) 75 MCG tablet Take 1 tablet by mouth every other day.   levothyroxine (SYNTHROID, LEVOTHROID) 88 MCG tablet Take 88 mcg by mouth every other day.    loratadine (CLARITIN) 10 MG tablet Take 10 mg by mouth daily as needed for allergies.   Magnesium 400 MG CAPS Take 1 capsule by mouth daily. With food   meclizine (ANTIVERT) 12.5 MG tablet Take 12.5 mg by mouth 3 (three) times daily as needed for dizziness.   Probiotic Product (ALIGN) 4 MG CAPS Take 1 tablet by mouth daily.   valACYclovir (VALTREX) 500 MG tablet Take 1 tablet (500 mg total) by mouth 2 (two) times daily. Take one tablet twice daily at onset of symptoms for 3 days.   No facility-administered encounter medications on file as of 12/24/2021.    Objective:   PHYSICAL EXAMINATION:    VITALS:  There were no vitals filed for this visit.  GEN:  The patient appears stated age and is in NAD. HEENT:  Normocephalic, atraumatic.  The mucous membranes are moist. The superficial temporal arteries are without ropiness or tenderness. CV:  RRR Lungs:  CTAB Neck/HEME:  There are no carotid bruits bilaterally.  Neurological examination:  Orientation: The patient is alert and oriented x3. Cranial nerves: There is good facial symmetry without facial hypomimia. The speech is fluent and clear. Soft palate rises symmetrically and there is no tongue deviation. Hearing is intact to conversational tone. Sensation: Sensation  is intact to light touch throughout Motor: Strength is at least antigravity x4.  Movement examination: Tone: There is nl tone in the UE/LE Abnormal movements: there is RUE rest tremor only with ambulation Coordination:  There is mild decremation with RAM's, with toe taps on the right Gait and Station: The patient has no difficulty arising out of a deep-seated chair without the use of the hands. The patient's stride length is good.    I have reviewed and interpreted the following labs independently    Chemistry       Component Value Date/Time   NA 137 12/23/2015 1430   K 4.2 12/23/2015 1430   CL 99 12/23/2015 1430   CO2 30 12/23/2015 1430   BUN 12 12/23/2015 1430   CREATININE 0.71 12/23/2015 1430      Component Value Date/Time   CALCIUM 9.7 12/23/2015 1430   ALKPHOS 71 12/23/2015 1430   AST 17 12/23/2015 1430   ALT 6 12/23/2015 1430   BILITOT 1.1 12/23/2015 1430       Lab Results  Component Value Date   WBC 6.7 12/23/2015   HGB 14.9 12/23/2015   HCT 45.0 12/23/2015   MCV 94.7 12/23/2015   PLT 325.0 12/23/2015    Lab Results  Component Value Date   TSH 7.92 (H) 12/23/2015     Total time spent on today's visit was *** minutes, including both face-to-face time and nonface-to-face time.  Time included that spent on review of records (prior notes available to me/labs/imaging if pertinent), discussing treatment and goals, answering patient's questions and coordinating care.  Cc:  Lavone Orn, MD

## 2021-12-24 ENCOUNTER — Other Ambulatory Visit: Payer: Self-pay | Admitting: Neurology

## 2021-12-24 ENCOUNTER — Ambulatory Visit: Payer: Medicare PPO | Admitting: Neurology

## 2021-12-27 ENCOUNTER — Encounter: Payer: Self-pay | Admitting: Neurology

## 2021-12-27 ENCOUNTER — Ambulatory Visit: Payer: Medicare PPO | Admitting: Neurology

## 2021-12-27 ENCOUNTER — Other Ambulatory Visit: Payer: Self-pay

## 2021-12-27 VITALS — BP 126/76 | HR 81 | Ht 63.0 in | Wt 185.2 lb

## 2021-12-27 DIAGNOSIS — G20 Parkinson's disease: Secondary | ICD-10-CM

## 2021-12-27 NOTE — Progress Notes (Signed)
Assessment/Plan:   1.  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  2.  Restless leg/arm syndrome  -Continue gabapentin, 600 mg at bedtime.  May need to add CR levodopa q hs if it becomes more problematic in the future.  S  Subjective:   Ashley Henderson was seen today in follow up for Parkinsons disease.  My previous records were reviewed prior to todays visit as well as outside records available to me.Pt denies falls.  States she has to be "very careful" to not fall.  She notes a "different gait."  She tripped yesterday over a new door mat and fell up against the door mat.  She is doing boxing at the club with a trainer.   Pt has occ lightheadedness, but no near syncope.  No hallucinations.  Mood has been good.  Still has occ restless arm syndrome - comes every couple weeks.  Will will lay a weight on the arms and it will help.  She is seeing Dr. [place] for dermatitis.    Current prescribed movement disorder medications: Carbidopa/levodopa 25/100, 1 tablet 3 times per day (told her to take it at 8 AM/noon/4 PM -previously waking up in the middle of the night to take it) Gabapentin, 600 mg at bedtime    ALLERGIES:   Allergies  Allergen Reactions   Belviq [Lorcaserin] Other (See Comments)    drowsiness   Other     antihistamines   Biaxin [Clarithromycin] Rash   Sulfa Antibiotics Rash    CURRENT MEDICATIONS:  Outpatient Encounter Medications as of 12/27/2021  Medication Sig   buPROPion (WELLBUTRIN XL) 300 MG 24 hr tablet Take 1 tablet by mouth daily.   carbidopa-levodopa (SINEMET IR) 25-100 MG tablet TAKE ONE TABLET THREE TIMES DAILY (8AM/NOON/4PM)   clobetasol ointment (TEMOVATE) 0.05 % APPLY TWICE DAILY--CAN USE FOR UP TO 7 DAYs   dicyclomine (BENTYL) 20 MG tablet Take 20 mg by mouth every 6 (six) hours. Takes as needed   Docusate Calcium (STOOL  SOFTENER PO) Take 1 tablet by mouth daily.    FIBER PO Take by mouth 2 (two) times daily.   fluticasone (FLONASE) 50 MCG/ACT nasal spray Place 2 sprays into the nose as needed.   gabapentin (NEURONTIN) 600 MG tablet Take 1 tablet (600 mg total) by mouth at bedtime.   levothyroxine (SYNTHROID, LEVOTHROID) 75 MCG tablet Take 1 tablet by mouth every other day.   levothyroxine (SYNTHROID, LEVOTHROID) 88 MCG tablet Take 88 mcg by mouth every other day.    loratadine (CLARITIN) 10 MG tablet Take 10 mg by mouth daily as needed for allergies.   Magnesium 400 MG CAPS Take 1 capsule by mouth daily. With food   meclizine (ANTIVERT) 12.5 MG tablet Take 12.5 mg by mouth 3 (three) times daily as needed for dizziness.   Probiotic Product (ALIGN) 4 MG CAPS Take 1 tablet by mouth daily.   valACYclovir (VALTREX) 500 MG tablet Take 1 tablet (500 mg total) by mouth 2 (two) times daily. Take one tablet twice daily at onset of symptoms for 3 days.   No facility-administered encounter medications on file as of 12/27/2021.    Objective:   PHYSICAL EXAMINATION:    VITALS:   Vitals:   12/27/21 0810  BP: 126/76  Pulse: 81  SpO2: 98%  Weight: 185 lb 3.2 oz (84 kg)  Height: 5\' 3"  (1.6 m)  GEN:  The patient appears stated age and is in NAD. HEENT:  Normocephalic, atraumatic.  The mucous membranes are moist. The superficial temporal arteries are without ropiness or tenderness. CV:  RRR Lungs:  CTAB Neck/HEME:  There are no carotid bruits bilaterally.  Neurological examination:  Orientation: The patient is alert and oriented x3. Cranial nerves: There is good facial symmetry without facial hypomimia. The speech is fluent and clear. Soft palate rises symmetrically and there is no tongue deviation. Hearing is intact to conversational tone. Sensation: Sensation is intact to light touch throughout Motor: Strength is at least antigravity x4.  Movement examination: Tone: There is nl tone in the UE/LE Abnormal  movements: there is RUE rest tremor only with ambulation Coordination:  There is mild decremation with RAM's, with toe taps on the left Gait and Station: The patient has no difficulty arising out of a deep-seated chair without the use of the hands. The patient's stride length is good.    I have reviewed and interpreted the following labs independently    Chemistry      Component Value Date/Time   NA 137 12/23/2015 1430   K 4.2 12/23/2015 1430   CL 99 12/23/2015 1430   CO2 30 12/23/2015 1430   BUN 12 12/23/2015 1430   CREATININE 0.71 12/23/2015 1430      Component Value Date/Time   CALCIUM 9.7 12/23/2015 1430   ALKPHOS 71 12/23/2015 1430   AST 17 12/23/2015 1430   ALT 6 12/23/2015 1430   BILITOT 1.1 12/23/2015 1430       Lab Results  Component Value Date   WBC 6.7 12/23/2015   HGB 14.9 12/23/2015   HCT 45.0 12/23/2015   MCV 94.7 12/23/2015   PLT 325.0 12/23/2015    Lab Results  Component Value Date   TSH 7.92 (H) 12/23/2015     Cc:  Lavone Orn, MD

## 2022-01-05 IMAGING — MR MR LUMBAR SPINE W/O CM
4 of 5 series · 19 of 48 positions shown · non-contrast
Comparison: Lumbar MRI 07/08/2017

CLINICAL DATA: Back pain with left hip pain

EXAM:
MRI LUMBAR SPINE WITHOUT CONTRAST
TECHNIQUE: Multiplanar, multisequence MR imaging of the lumbar spine was
performed. No intravenous contrast was administered.

[Series 6: T2 · sagittal · 4.0mm · 0.73mm/px · 6 of 13 slices shown (1 of 2)]
[im 1/13]
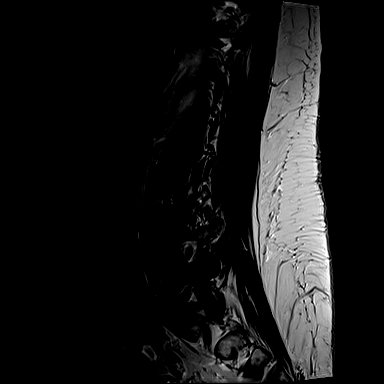
[im 3/13]
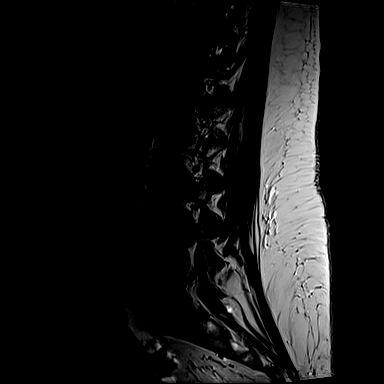
[im 5/13]
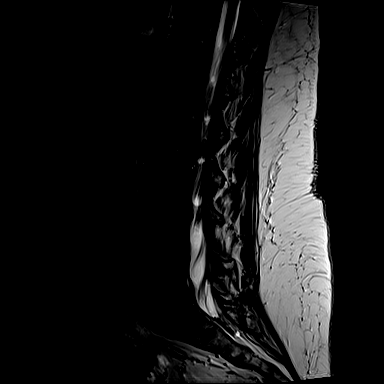
[im 8/13]
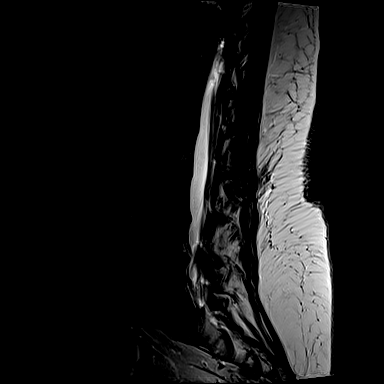
[im 10/13]
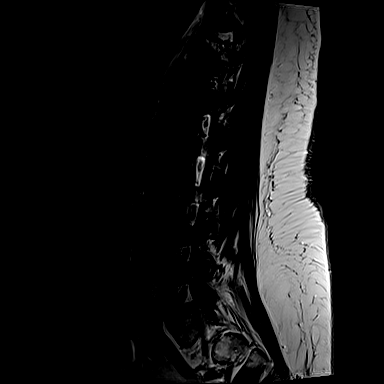
[im 13/13]
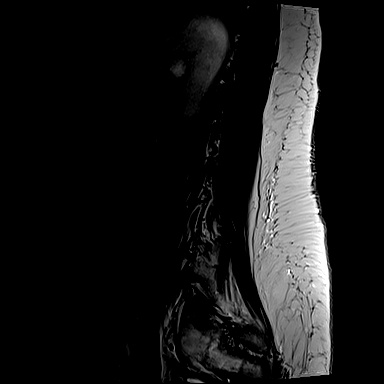

[Series 7: T1 · sagittal · 4.0mm · 0.73mm/px · 3 of 13 slices shown (1 of 2)]
[im 1/13]
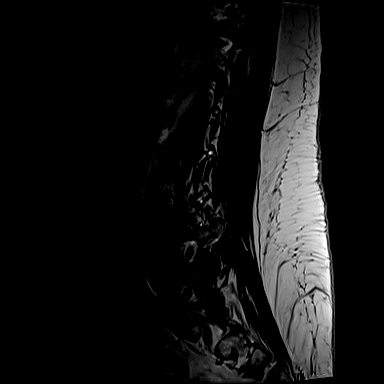
[im 7/13]
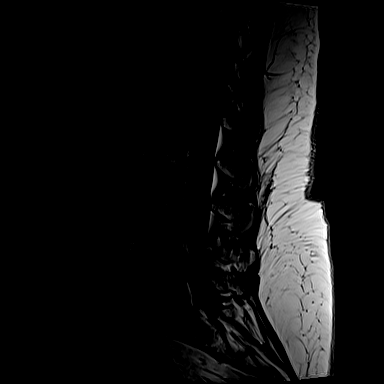
[im 13/13]
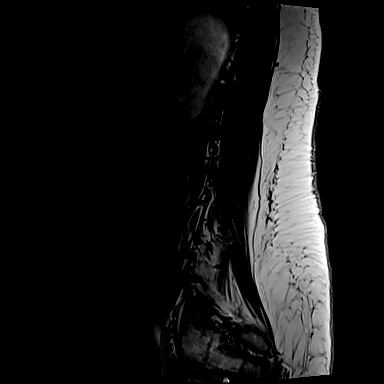

[Series 11: T2 · axial · 4.0mm · 0.28mm/px · z∈[-119,+50]mm · 7 of 39 slices shown (2 of 2)]
[im 3/39]
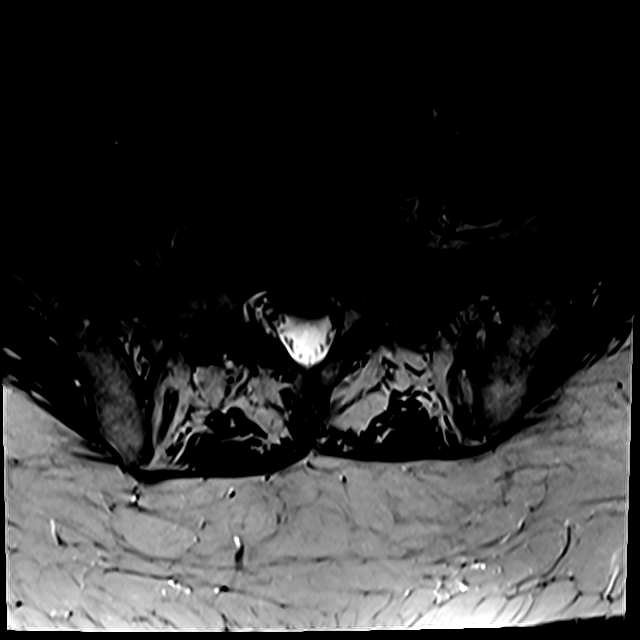
[im 6/39]
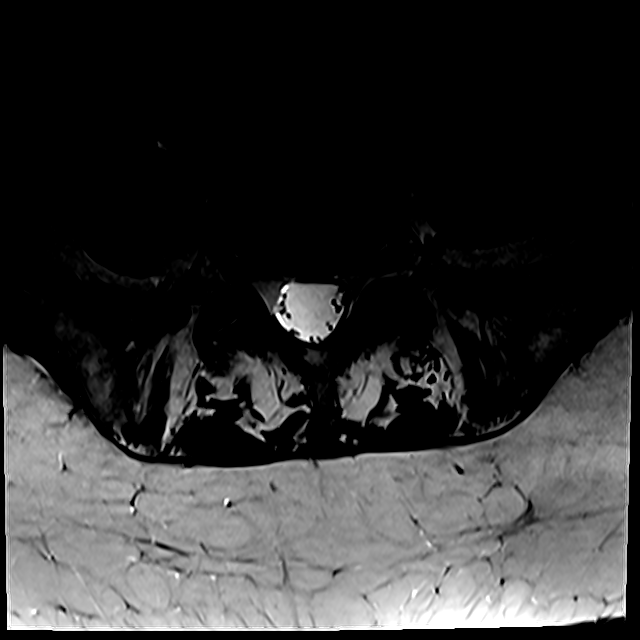
[im 8/39]
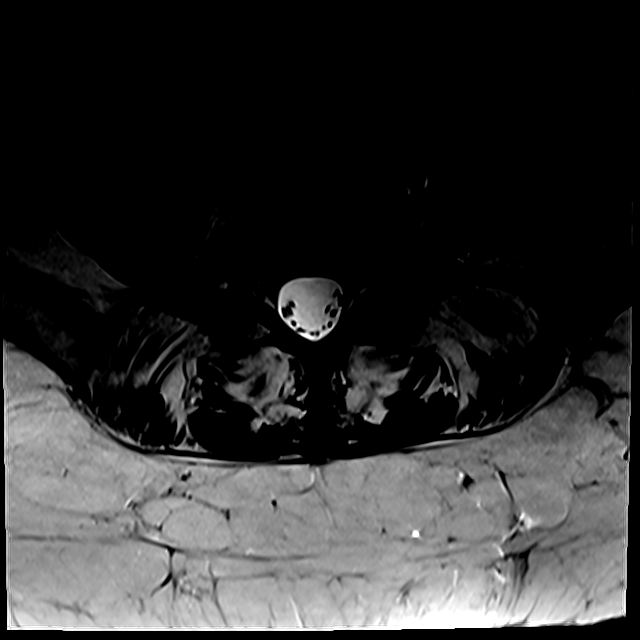
[im 13/39]
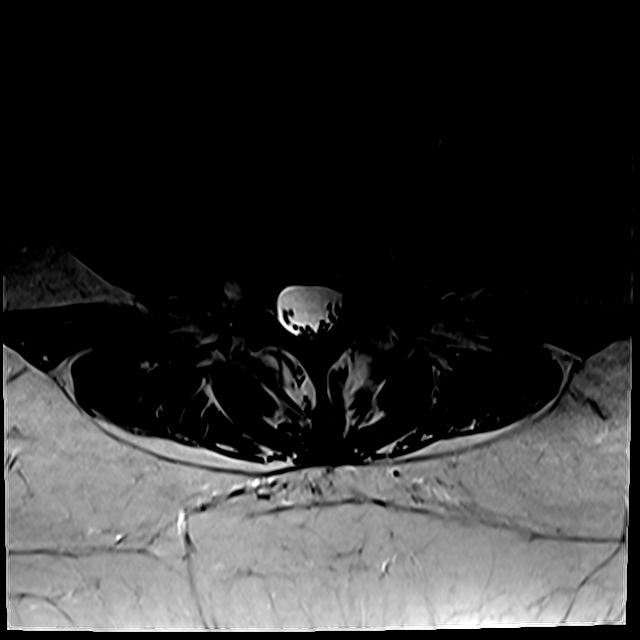
[im 18/39]
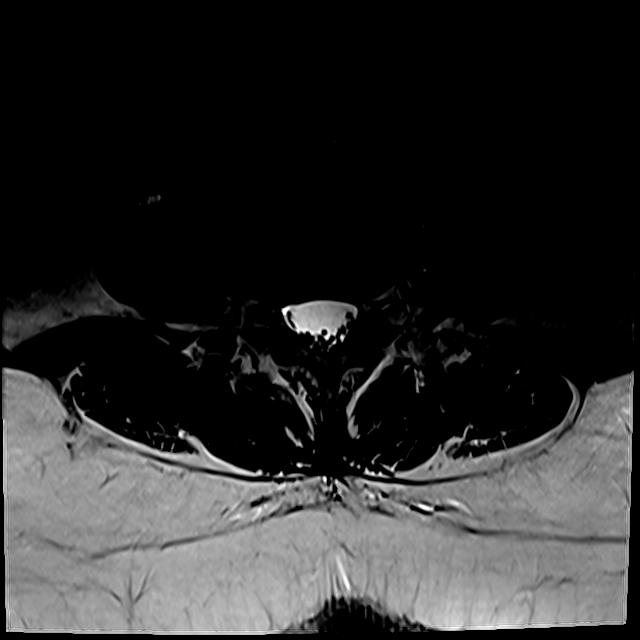
[im 21/39]
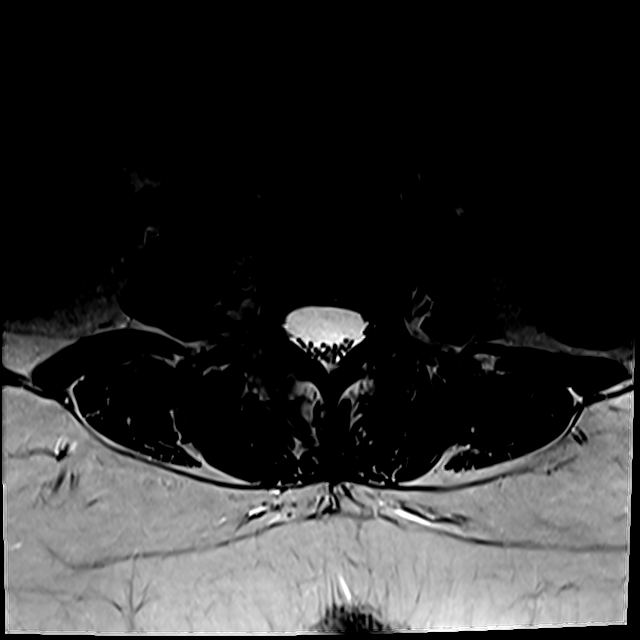
[im 33/39]
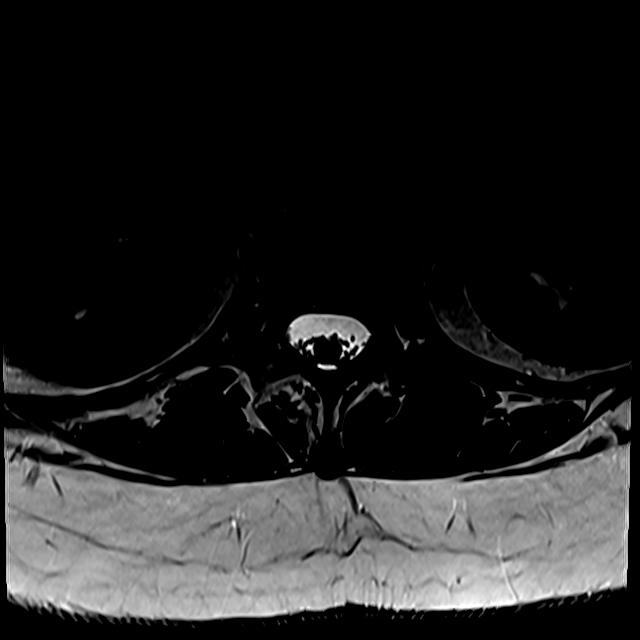

[Series 14: T1 · axial · 4.0mm · 0.28mm/px · z∈[-104,+50]mm · 3 of 39 slices shown (2 of 2)]
[im 6/39]
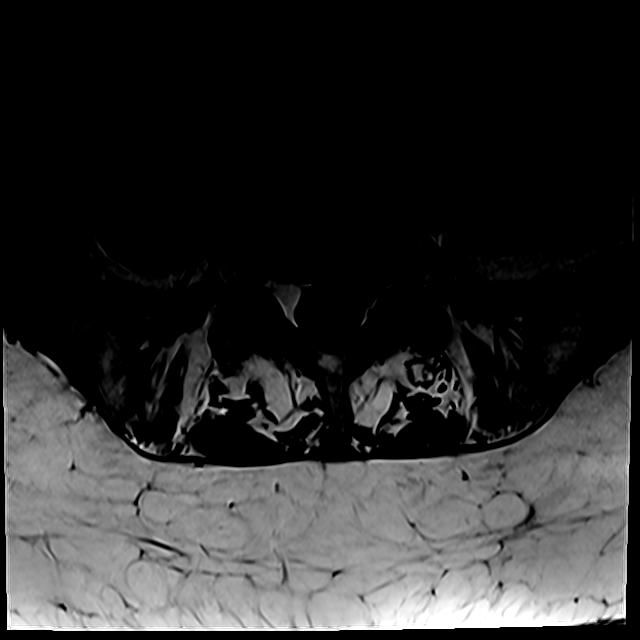
[im 21/39]
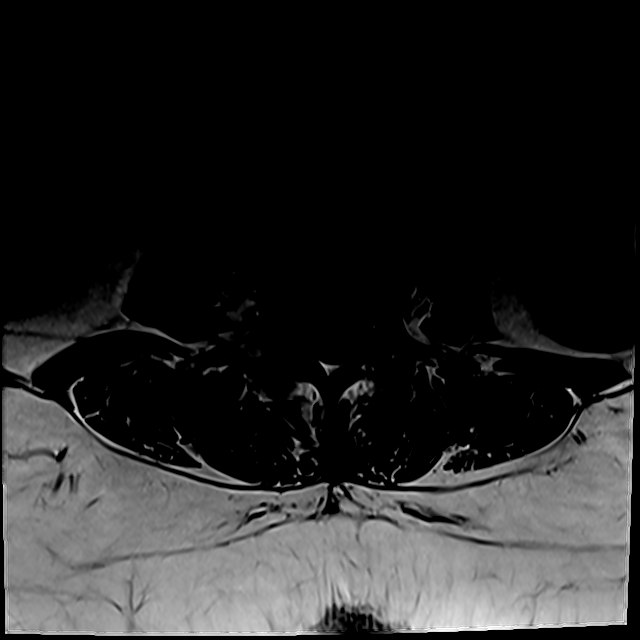
[im 33/39]
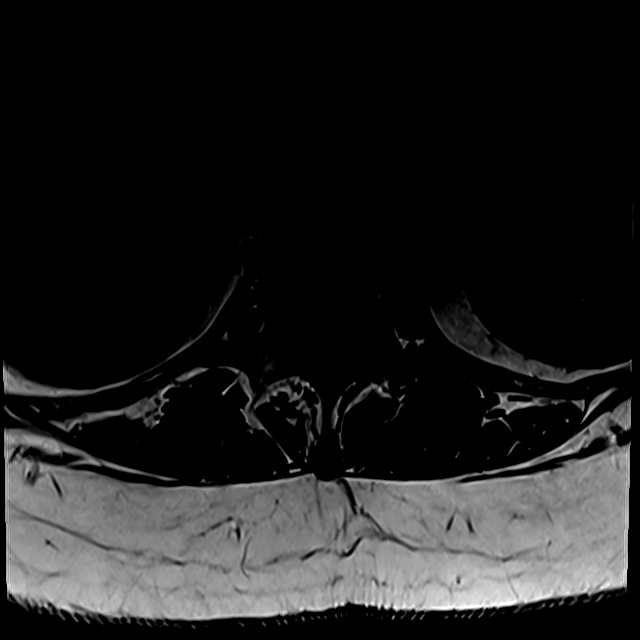

[19 of 48 positions shown; findings below may reference images not displayed]

FINDINGS: Segmentation:    Normal

Alignment:  Normal

Vertebrae: Normal bone marrow. Negative for fracture or mass. Mild
discogenic bone marrow edema anteriorly at L3-4.

Conus medullaris and cauda equina: Conus extends to the L2 level.
Conus and cauda equina appear normal.

Paraspinal and other soft tissues: Negative for paraspinous mass or
adenopathy.

Disc levels:

T12-L1: Negative

L1-2: Mild disc degeneration and Schmorl's nodes. Negative for
stenosis

L2-3: Mild disc degeneration and Schmorl's nodes. Negative for
stenosis. Small right-sided disc protrusion unchanged.

L3-4: Mild disc degeneration with disc bulging and endplate
spurring. Mild facet degeneration. Negative for stenosis.

L4-5: Moderate disc degeneration. Disc space narrowing, disc bulging
and endplate spurring. No significant stenosis.

L5-S1: Negative
IMPRESSION: Mild lumbar degenerative change.  Negative for neural impingement.

## 2022-02-04 DIAGNOSIS — L821 Other seborrheic keratosis: Secondary | ICD-10-CM | POA: Diagnosis not present

## 2022-02-04 DIAGNOSIS — Z85828 Personal history of other malignant neoplasm of skin: Secondary | ICD-10-CM | POA: Diagnosis not present

## 2022-02-04 DIAGNOSIS — D485 Neoplasm of uncertain behavior of skin: Secondary | ICD-10-CM | POA: Diagnosis not present

## 2022-02-04 DIAGNOSIS — L298 Other pruritus: Secondary | ICD-10-CM | POA: Diagnosis not present

## 2022-02-04 DIAGNOSIS — D1801 Hemangioma of skin and subcutaneous tissue: Secondary | ICD-10-CM | POA: Diagnosis not present

## 2022-02-22 DIAGNOSIS — J3089 Other allergic rhinitis: Secondary | ICD-10-CM | POA: Diagnosis not present

## 2022-02-22 DIAGNOSIS — R21 Rash and other nonspecific skin eruption: Secondary | ICD-10-CM | POA: Diagnosis not present

## 2022-03-17 ENCOUNTER — Other Ambulatory Visit: Payer: Self-pay

## 2022-03-17 DIAGNOSIS — G20 Parkinson's disease: Secondary | ICD-10-CM

## 2022-03-17 MED ORDER — CARBIDOPA-LEVODOPA 25-100 MG PO TABS: ORAL_TABLET | ORAL | 0 refills | Status: DC

## 2022-04-06 ENCOUNTER — Ambulatory Visit: Payer: Medicare PPO | Admitting: Neurology

## 2022-04-18 DIAGNOSIS — M47816 Spondylosis without myelopathy or radiculopathy, lumbar region: Secondary | ICD-10-CM | POA: Diagnosis not present

## 2022-04-20 DIAGNOSIS — H2513 Age-related nuclear cataract, bilateral: Secondary | ICD-10-CM | POA: Diagnosis not present

## 2022-04-20 DIAGNOSIS — H5203 Hypermetropia, bilateral: Secondary | ICD-10-CM | POA: Diagnosis not present

## 2022-04-20 DIAGNOSIS — H40051 Ocular hypertension, right eye: Secondary | ICD-10-CM | POA: Diagnosis not present

## 2022-04-25 DIAGNOSIS — M6281 Muscle weakness (generalized): Secondary | ICD-10-CM | POA: Diagnosis not present

## 2022-04-25 DIAGNOSIS — M47816 Spondylosis without myelopathy or radiculopathy, lumbar region: Secondary | ICD-10-CM | POA: Diagnosis not present

## 2022-05-04 DIAGNOSIS — H8111 Benign paroxysmal vertigo, right ear: Secondary | ICD-10-CM | POA: Diagnosis not present

## 2022-05-10 DIAGNOSIS — M47816 Spondylosis without myelopathy or radiculopathy, lumbar region: Secondary | ICD-10-CM | POA: Diagnosis not present

## 2022-05-10 DIAGNOSIS — M6281 Muscle weakness (generalized): Secondary | ICD-10-CM | POA: Diagnosis not present

## 2022-05-11 DIAGNOSIS — H8111 Benign paroxysmal vertigo, right ear: Secondary | ICD-10-CM | POA: Diagnosis not present

## 2022-05-18 DIAGNOSIS — M6281 Muscle weakness (generalized): Secondary | ICD-10-CM | POA: Diagnosis not present

## 2022-05-18 DIAGNOSIS — M47816 Spondylosis without myelopathy or radiculopathy, lumbar region: Secondary | ICD-10-CM | POA: Diagnosis not present

## 2022-05-20 DIAGNOSIS — M6281 Muscle weakness (generalized): Secondary | ICD-10-CM | POA: Diagnosis not present

## 2022-05-20 DIAGNOSIS — M47816 Spondylosis without myelopathy or radiculopathy, lumbar region: Secondary | ICD-10-CM | POA: Diagnosis not present

## 2022-05-23 DIAGNOSIS — M47816 Spondylosis without myelopathy or radiculopathy, lumbar region: Secondary | ICD-10-CM | POA: Diagnosis not present

## 2022-05-23 DIAGNOSIS — M6281 Muscle weakness (generalized): Secondary | ICD-10-CM | POA: Diagnosis not present

## 2022-06-03 DIAGNOSIS — M47816 Spondylosis without myelopathy or radiculopathy, lumbar region: Secondary | ICD-10-CM | POA: Diagnosis not present

## 2022-06-03 DIAGNOSIS — M6281 Muscle weakness (generalized): Secondary | ICD-10-CM | POA: Diagnosis not present

## 2022-06-10 ENCOUNTER — Other Ambulatory Visit: Payer: Self-pay | Admitting: Neurology

## 2022-06-20 ENCOUNTER — Ambulatory Visit (INDEPENDENT_AMBULATORY_CARE_PROVIDER_SITE_OTHER): Payer: Medicare PPO | Admitting: Obstetrics & Gynecology

## 2022-06-20 ENCOUNTER — Encounter (HOSPITAL_BASED_OUTPATIENT_CLINIC_OR_DEPARTMENT_OTHER): Payer: Self-pay | Admitting: Obstetrics & Gynecology

## 2022-06-20 VITALS — BP 149/85 | HR 70 | Ht 63.0 in | Wt 183.2 lb

## 2022-06-20 DIAGNOSIS — Z780 Asymptomatic menopausal state: Secondary | ICD-10-CM | POA: Diagnosis not present

## 2022-06-20 DIAGNOSIS — G20 Parkinson's disease: Secondary | ICD-10-CM

## 2022-06-20 DIAGNOSIS — G20A1 Parkinson's disease without dyskinesia, without mention of fluctuations: Secondary | ICD-10-CM

## 2022-06-20 DIAGNOSIS — Z803 Family history of malignant neoplasm of breast: Secondary | ICD-10-CM

## 2022-06-20 DIAGNOSIS — Z9189 Other specified personal risk factors, not elsewhere classified: Secondary | ICD-10-CM | POA: Diagnosis not present

## 2022-06-20 DIAGNOSIS — L280 Lichen simplex chronicus: Secondary | ICD-10-CM

## 2022-06-20 DIAGNOSIS — A6000 Herpesviral infection of urogenital system, unspecified: Secondary | ICD-10-CM | POA: Diagnosis not present

## 2022-06-20 MED ORDER — VALACYCLOVIR HCL 500 MG PO TABS: 500.0000 mg | ORAL_TABLET | Freq: Two times a day (BID) | ORAL | 3 refills | Status: DC

## 2022-06-20 MED ORDER — CLOBETASOL PROPIONATE 0.05 % EX OINT: TOPICAL_OINTMENT | CUTANEOUS | 1 refills | Status: DC

## 2022-06-20 NOTE — Progress Notes (Signed)
81 y.o. Ashley Henderson Married White or Caucasian female here for breast and pelvic exam.  I am also following her for personally increased risk of breast cancer.  H/o HSV and usese valtrex prn.  Reports she's having more issues with claustrophobia and having issues with riding elevators.    H/o lichen simplex chronicus.  Uses topical clobetasol prn.  Needs refill.  Still seeing Dr. Carles Collet for Parkinson's.  On carbidopa-levodopa.  Symptoms are under good control.  Doing boxing with personal trainer.  Doing this twice weekly.    Denies vaginal bleeding.  Patient's last menstrual period was 12/13/1995.          Sexually active: No.  H/O STD:  h/o HSV  Health Maintenance: PCP:  Dr. Laurann Montana.  Last wellness appt was earlier this year.  Did blood work at that appt: yes   Vaccines are up to date:  tdap due per my records Colonoscopy:  09/11/2013 MMG:  10/04/2021 Negative BMD:  07/24/2019 Last pap smear:  10/29/2018 Negative H/o abnormal pap smear:  no    reports that she has never smoked. She has never used smokeless tobacco. She reports that she does not currently use alcohol. She reports that she does not use drugs.  Past Medical History:  Diagnosis Date   Abnormal uterine bleeding (AUB)    endo biopsy negative   ADD (attention deficit disorder)    Allergic rhinitis    Anxiety    Bouchard nodes (DJD hand)    Claustrophobia    Colon polyp    Cystic acne    Depression    Diverticulosis    Glaucoma    Goiter    Hematuria 1994   negative cysto, IVP   Hemorrhoids    Hyperlipidemia    Hyperplastic colon polyp    Hypothyroidism    IBS (irritable bowel syndrome)    using align-saw Dr Olevia Perches in 2014   Obesity    Parkinson disease ([place])    Plantar fasciitis    Restless leg syndrome    Vertigo     Past Surgical History:  Procedure Laterality Date   breast implants replaced  6/08   GLAUCOMA SURGERY     bilateral laser   SHOULDER SURGERY  3/03, 9/01   bilateral   SIMPLE MASTECTOMY   1984   bilateral with implants (prophylactic mastectomy)    Current Outpatient Medications  Medication Sig Dispense Refill   buPROPion (WELLBUTRIN XL) 300 MG 24 hr tablet Take 1 tablet by mouth daily.     carbidopa-levodopa (SINEMET IR) 25-100 MG tablet TAKE ONE TABLET THREE TIMES DAILY (8AM/NOON/4PM) 270 tablet 0   clobetasol ointment (TEMOVATE) 0.05 % APPLY TWICE DAILY--CAN USE FOR UP TO 7 DAYs 60 g 1   dicyclomine (BENTYL) 20 MG tablet Take 20 mg by mouth every 6 (six) hours. Takes as needed     Docusate Calcium (STOOL SOFTENER PO) Take 1 tablet by mouth daily.      FIBER PO Take by mouth 2 (two) times daily.     fluticasone (FLONASE) 50 MCG/ACT nasal spray Place 2 sprays into the nose as needed.     gabapentin (NEURONTIN) 600 MG tablet TAKE ONE TABLET AT BEDTIME 90 tablet 0   levothyroxine (SYNTHROID, LEVOTHROID) 75 MCG tablet Take 1 tablet by mouth every other day.     levothyroxine (SYNTHROID, LEVOTHROID) 88 MCG tablet Take 88 mcg by mouth every other day.      loratadine (CLARITIN) 10 MG tablet Take 10 mg by mouth daily  as needed for allergies.     Magnesium 400 MG CAPS Take 1 capsule by mouth daily. With food     meclizine (ANTIVERT) 12.5 MG tablet Take 12.5 mg by mouth 3 (three) times daily as needed for dizziness.     meloxicam (MOBIC) 15 MG tablet Take 15 mg by mouth daily.     Probiotic Product (ALIGN) 4 MG CAPS Take 1 tablet by mouth daily.     valACYclovir (VALTREX) 500 MG tablet Take 1 tablet (500 mg total) by mouth 2 (two) times daily. Take one tablet twice daily at onset of symptoms for 3 days. 6 tablet 1   No current facility-administered medications for this visit.    Family History  Problem Relation Age of Onset   Breast cancer Mother    Bone cancer Mother    Diabetes Mother    Heart attack Father    Parkinson's disease Father    Alzheimer's disease Father    CAD Brother    Hypertension Brother    Breast cancer Maternal Grandmother    Breast cancer Maternal  Aunt    Breast cancer Paternal Aunt    Cancer - Colon Paternal Uncle    Colon cancer Neg Hx     Review of Systems  Constitutional: Negative.   Genitourinary: Negative.     Exam:   BP (!) 149/85 (BP Location: Left Arm, Patient Position: Sitting, Cuff Size: Normal)   Pulse 70   Ht '5\' 3"'$  (1.6 m) Comment: Reported  Wt 183 lb 3.2 oz (83.1 kg)   LMP 12/13/1995   BMI 32.45 kg/m   Height: '5\' 3"'$  (160 cm) (Reported)  General appearance: alert, cooperative and appears stated age Breasts: normal appearance, no masses or tenderness Abdomen: soft, non-tender; bowel sounds normal; no masses,  no organomegaly Lymph nodes: Cervical, supraclavicular, and axillary nodes normal.  No abnormal inguinal nodes palpated Neurologic: Grossly normal  Pelvic: External genitalia:  no lesions              Urethra:  normal appearing urethra with no masses, tenderness or lesions              Bartholins and Skenes: normal                 Vagina: normal appearing vagina with atrophic changes and no discharge, no lesions              Cervix: no lesions              Pap taken: No. Bimanual Exam:  Uterus:  normal size, contour, position, consistency, mobility, non-tender              Adnexa: normal adnexa and no mass, fullness, tenderness               Rectovaginal: Confirms               Anus:  normal sphincter tone, no lesions  Chaperone, Octaviano Batty, CMA, was present for exam.  Assessment/Plan: 1. Encounter for gynecologic examination for high-risk patient covered by Medicare - last pap 2019.  Guidelines reviewed. - MMG 10/04/2021 - BMD 07/2019 - vaccines reviewed.  I do not have tdap documented since 2012 - colonoscopy 09/2013.  Follow up 2014. - lab work done with Dr. Laurann Montana  2. Genital herpes simplex, unspecified site - valACYclovir (VALTREX) 500 MG tablet; Take 1 tablet (500 mg total) by mouth 2 (two) times daily. Take one tablet twice daily at onset of symptoms for  3 days.  Dispense: 6 tablet;  Refill: 3  3. Family history of breast cancer - h/o bilateral simple mastectomy in 1984 - h/o negative genetic testing  4. Parkinson's disease ([place]) - followed by Dr. Carles Collet  5. Postmenopausal - no HRT  6. Lichen simplex chronicus - clobetasol ointment (TEMOVATE) 0.05 %; APPLY TWICE DAILY--CAN USE FOR UP TO 7 DAYs  Dispense: 60 g; Refill: 1

## 2022-06-22 ENCOUNTER — Other Ambulatory Visit: Payer: Self-pay | Admitting: Neurology

## 2022-06-22 DIAGNOSIS — G20 Parkinson's disease: Secondary | ICD-10-CM

## 2022-06-27 ENCOUNTER — Other Ambulatory Visit: Payer: Self-pay | Admitting: Neurology

## 2022-06-27 DIAGNOSIS — G20 Parkinson's disease: Secondary | ICD-10-CM

## 2022-06-27 NOTE — Telephone Encounter (Signed)
Patient called and stated she is going to run out of carbidopa-levodopa in the next few days.  The pharmacy told her to call here.

## 2022-06-28 MED ORDER — CARBIDOPA-LEVODOPA 25-100 MG PO TABS: ORAL_TABLET | ORAL | 0 refills | Status: DC

## 2022-06-30 ENCOUNTER — Telehealth: Payer: Self-pay | Admitting: Neurology

## 2022-06-30 NOTE — Telephone Encounter (Signed)
Drug store called pt medication is ready that was sent in on 06/28/22 for her to come by and pick up, pt  called an informed that her medication was ready

## 2022-06-30 NOTE — Telephone Encounter (Signed)
Pt called in stating she will run out of her carbidopa-levodopa before her 07/05/22 appointment and the pharmacy said they cannot refill it. She has enough to last through tomorrow. She is wondering what she needs to do?

## 2022-07-04 NOTE — Progress Notes (Unsigned)
Assessment/Plan:   1.  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  2.  Restless leg/arm syndrome  -Continue gabapentin, 600 mg at bedtime.  May need to add CR levodopa q hs if it becomes more problematic in the future.    Subjective:   Ashley Henderson was seen today in follow up for Parkinsons disease.  My previous records were reviewed prior to todays visit as well as outside records available to me.  Pt denies falls.  Pt denies lightheadedness, near syncope.  No hallucinations.  Mood has been good.  She is on gabapentin at nighttime for restless leg and reports that ***  Current prescribed movement disorder medications: Carbidopa/levodopa 25/100, 1 tablet 3 times per day (told her to take it at 8 AM/noon/4 PM -previously waking up in the middle of the night to take it) Gabapentin, 600 mg at bedtime    ALLERGIES:   Allergies  Allergen Reactions   Belviq [Lorcaserin] Other (See Comments)    drowsiness   Other     antihistamines   Biaxin [Clarithromycin] Rash   Sulfa Antibiotics Rash    CURRENT MEDICATIONS:  Outpatient Encounter Medications as of 07/05/2022  Medication Sig   buPROPion (WELLBUTRIN XL) 300 MG 24 hr tablet Take 1 tablet by mouth daily.   carbidopa-levodopa (SINEMET IR) 25-100 MG tablet TAKE ONE TABLET THREE TIMES DAILY (8AM/NOON/4PM)   clobetasol ointment (TEMOVATE) 0.05 % APPLY TWICE DAILY--CAN USE FOR UP TO 7 DAYs   dicyclomine (BENTYL) 20 MG tablet Take 20 mg by mouth every 6 (six) hours. Takes as needed   Docusate Calcium (STOOL SOFTENER PO) Take 1 tablet by mouth daily.    FIBER PO Take by mouth 2 (two) times daily.   fluticasone (FLONASE) 50 MCG/ACT nasal spray Place 2 sprays into the nose as needed.   gabapentin (NEURONTIN) 600 MG tablet TAKE ONE TABLET AT BEDTIME   levothyroxine (SYNTHROID, LEVOTHROID) 75 MCG tablet  Take 1 tablet by mouth every other day.   levothyroxine (SYNTHROID, LEVOTHROID) 88 MCG tablet Take 88 mcg by mouth every other day.    loratadine (CLARITIN) 10 MG tablet Take 10 mg by mouth daily as needed for allergies.   Magnesium 400 MG CAPS Take 1 capsule by mouth daily. With food   meclizine (ANTIVERT) 12.5 MG tablet Take 12.5 mg by mouth 3 (three) times daily as needed for dizziness.   meloxicam (MOBIC) 15 MG tablet Take 15 mg by mouth daily.   Probiotic Product (ALIGN) 4 MG CAPS Take 1 tablet by mouth daily.   valACYclovir (VALTREX) 500 MG tablet Take 1 tablet (500 mg total) by mouth 2 (two) times daily. Take one tablet twice daily at onset of symptoms for 3 days.   No facility-administered encounter medications on file as of 07/05/2022.    Objective:   PHYSICAL EXAMINATION:    VITALS:   There were no vitals filed for this visit.   GEN:  The patient appears stated age and is in NAD. HEENT:  Normocephalic, atraumatic.  The mucous membranes are moist. The superficial temporal arteries are without ropiness or tenderness. CV:  RRR Lungs:  CTAB Neck/HEME:  There are no carotid bruits bilaterally.  Neurological examination:  Orientation: The patient is alert and oriented x3. Cranial nerves: There is good facial symmetry without facial hypomimia. The speech is fluent and clear. Soft palate rises symmetrically and there  is no tongue deviation. Hearing is intact to conversational tone. Sensation: Sensation is intact to light touch throughout Motor: Strength is at least antigravity x4.  Movement examination: Tone: There is nl tone in the UE/LE Abnormal movements: there is RUE rest tremor only with ambulation Coordination:  There is mild decremation with RAM's, with toe taps on the left Gait and Station: The patient has no difficulty arising out of a deep-seated chair without the use of the hands. The patient's stride length is good.      Total time spent on today's visit was ***  minutes, including both face-to-face time and nonface-to-face time.  Time included that spent on review of records (prior notes available to me/labs/imaging if pertinent), discussing treatment and goals, answering patient's questions and coordinating care.   Cc:  Lavone Orn, MD

## 2022-07-05 ENCOUNTER — Ambulatory Visit: Payer: Medicare PPO | Admitting: Neurology

## 2022-07-05 ENCOUNTER — Encounter: Payer: Self-pay | Admitting: Neurology

## 2022-07-05 VITALS — BP 116/81 | HR 81 | Ht 63.0 in | Wt 182.0 lb

## 2022-07-05 DIAGNOSIS — G20 Parkinson's disease: Secondary | ICD-10-CM

## 2022-07-05 NOTE — Patient Instructions (Signed)
Local and Online Resources for Power over Parkinson's Group June 2023  LOCAL [place] PARKINSON'S GROUPS  Power over Parkinson's Group:   Power Over Parkinson's Patient Education Group will be Wednesday, June 14th-*Hybrid meting*- in person at [facility] location and via WEBEX at 2:00 pm.   Upcoming Power over Parkinson's Meetings:  2nd Wednesdays of the month at 2 pm:   June 14th, July 12th Contact Amy Marriott at [email or web address] if interested in participating in this group Parkinson's Care Partners Group:    3rd Mondays, Contact Misty Paladino Atypical Parkinsonian Patient Group:   4th Wednesdays, Contact Misty Paladino If you are interested in participating in these groups with Misty, please contact her directly for how to join those meetings.  Her contact information is [email or web address].    LOCAL EVENTS AND NEW OFFERINGS Dance Class for People with Parkinson's at [employer].  Friday, June 9th at 2 pm.  Led by [employer] DPT students.  Contact [email or web address] to register or with questions. Ice Cream Social at [employer]!  Thursday, June 15th, 5:30-7:00 pm.  RSVP to [email or web address] for attendance and free ice cream. Parkinson's T-shirts for sale!  Designed by a local group member, with funds going to Movement Disorders Fund.  $25.00  Contact Misty to purchase  New PWR! Moves Community Fitness Instructor-Led Class offering at [employer]!  Wednesdays 1-2 pm, starting April 12th.   Contact Susan Laney, Fitness Manager at [employer].  [email or web address]  ONLINE EDUCATION AND SUPPORT Parkinson Foundation:  www.parkinson.org PD Health at Home continues:  Mindfulness Mondays, Wellness Wednesdays, Fitness Fridays  Upcoming Education: Parkinson's 101:  What You and Your Family Should Know.  Wednesday, June 7th at 1:00 pm Register for expert briefings (webinars) at  https://www.[email or web address].org/resources-support/online-education/expert-briefings-webinars Please check out their website to sign up for emails and see their full online offerings   [employer] Foundation:  www.michaeljfox.org  Third Thursday Webinars:  On the third Thursday of every month at 12 p.m. ET, join our free live webinars to learn about various aspects of living with Parkinson's disease and our work to speed medical breakthroughs. Upcoming Webinar: REPLAY:  From Low Blood Pressure to Bladder Problems:  A Look at Lesser Known Parkinson's Symptoms.  Thursday, June 15th at 12 noon. Check out additional information on their website to see their full online offerings  [place] [facility] Foundation:  www.davisphinneyfoundation.org Upcoming Webinar:   Stay tuned Webinar Series:  Living with Parkinson's Meetup.   Third Thursdays each month, 3 pm Care Partner Monthly Meetup.  With Connie Carpenter Phinney.  First Tuesday of each month, 2 pm Check out additional information to Live Well Today on their website  Parkinson and Movement Disorders (PMD) Alliance:  www.pmdalliance.org [employer] Online:  Online Education Events Sign up for emails, which are sent weekly to give you updates on programming and online offerings  Parkinson's Association of [place]:  www.parkinsonassociation.org Information on online support groups, education events, and online exercises including Yoga, Parkinson's exercises and more-LOTS of information on links to PD resources and online events Virtual Support Group through Parkinson's Association of [place]; next one is scheduled for Wednesday, June 7th at 2 pm. (These are typically scheduled for the 1st Wednesday of the month at 2 pm).  Visit website for details. Save the date for "Caring for Parkinson's-Caring for You", 9th Annual Symposium.  In-person event in [place].  September 9th.  More info on registration to come. MOVEMENT AND EXERCISE OPPORTUNITIES PWR!  Moves Classes at [facility].  Wednesdays 10 and 11   am.   Contact Amy Marriott, PT [email or web address] if interested. NEW PWR! Moves Class offering at [employer].  Wednesdays 1-2 pm, starting April 12th.  Contact Susan Laney, Fitness Manager at [employer].  [email or web address] Here is a link to the PWR!Moves classes on Zoom from [place] Parkinson's Foundation - Daily Mon-Sat at 10:00. Via Zoom, FREE and open to all.  There is also a link below via Facebook if you use that platform.  https://www.parkinsonsmi.org/mpf-programs/exercise-and-movement-activities https://www.facebook.com/ParkinsonsMI.org/posts/pwr-moves-exercise-class-parkinson-wellness-recovery-online-with-angee-ludwa-pt-/10156827878021813/  Parkinson's Wellness Recovery (PWR! Moves)  www.pwr4life.org Info on the PWR! Virtual Experience:  You will have access to our expertise through self-assessment, guided plans that start with the PD-specific fundamentals, educational content, tips, Q&A with an expert, and a growing library of PD-specific pre-recorded and live exercise classes of varying types and intensity - both physical and cognitive! If that is not enough, we offer 1:1 wellness consultations (in-person or virtual) to personalize your PWR! Virtual Experience.  Parkinson Foundation Fitness Fridays:  As part of the PD Health @ Home program, this free video series focuses each week on one aspect of fitness designed to support people living with Parkinson's.  These weekly videos highlight the Parkinson Foundation recent fitness guidelines for people with Parkinson's disease. www.parkinson.org/resources-support/online-education/pdhealth#ff Dance for PD website is offering free, live-stream classes throughout the week, as well as links to digital library of classes:  https://danceforparkinsons.org/ Virtual dance and Pilates for Parkinson's classes: Click on the Community Tab> Parkinson's Movement Initiative  Tab.  To register for classes and for more information, visit www.americandancefestival.org and click the "community" tab.  [employer] Parkinson's Cycling Classes  [employer]:  Thursdays @ Noon-Live classes at [employer] (Contact Margaret Hazen at [email or web address] or [phone]) [employer]: Virtual Classes Mondays and Thursdays /Live classes Tuesday, Wednesday and Thursday (contact Marlee at [email or web address]  or [phone]) [place] [employer]ady Boxing Varied levels of classes are offered Tuesdays and Thursdays at [employer].  Stretching with Maria weekly class is also offered for people with Parkinson's To observe a class or for more information, call [phone] or email Hillary Savage at [email or web address] ADDITIONAL SUPPORT AND RESOURCES [employer]:Online Caregiver Education Opportunities:  www.well-springsolutions.org/caregiver-education/caregiver-support-group.  You may also contact Jodi Kolada at [email or web address] or [phone].    [facility] Navigator:  Just1Navigator program, a free service to help individuals and families through the journey of determining care for older adults.  The "Navigator" is a social worker, Nicole Reynolds, who will speak with a prospective client and/or loved ones to provide an assessment of the situation and a set of recommendations for a personalized care plan -- all free of charge, and whether [facility] [employer] offers the needed service or not. If the need is not a service we provide, we are well-connected with reputable programs in town that we can refer you to.  www.well-springsolutions.org or to speak with the Navigator, call [phone]. Family Caregiver Programming in June:  [employer], Thursday, June 15th 11-12:30 at [employer], [place].  Call [phone] to register  

## 2022-07-12 DIAGNOSIS — H25812 Combined forms of age-related cataract, left eye: Secondary | ICD-10-CM | POA: Diagnosis not present

## 2022-07-12 DIAGNOSIS — H2512 Age-related nuclear cataract, left eye: Secondary | ICD-10-CM | POA: Diagnosis not present

## 2022-07-12 DIAGNOSIS — H269 Unspecified cataract: Secondary | ICD-10-CM | POA: Diagnosis not present

## 2022-07-26 DIAGNOSIS — H269 Unspecified cataract: Secondary | ICD-10-CM | POA: Diagnosis not present

## 2022-07-26 DIAGNOSIS — H2511 Age-related nuclear cataract, right eye: Secondary | ICD-10-CM | POA: Diagnosis not present

## 2022-07-26 DIAGNOSIS — H52201 Unspecified astigmatism, right eye: Secondary | ICD-10-CM | POA: Diagnosis not present

## 2022-09-13 ENCOUNTER — Other Ambulatory Visit: Payer: Self-pay | Admitting: Neurology

## 2022-09-24 DIAGNOSIS — Z23 Encounter for immunization: Secondary | ICD-10-CM | POA: Diagnosis not present

## 2022-09-26 ENCOUNTER — Other Ambulatory Visit: Payer: Self-pay | Admitting: Neurology

## 2022-09-26 DIAGNOSIS — G20A1 Parkinson's disease without dyskinesia, without mention of fluctuations: Secondary | ICD-10-CM

## 2022-10-10 DIAGNOSIS — Z1231 Encounter for screening mammogram for malignant neoplasm of breast: Secondary | ICD-10-CM | POA: Diagnosis not present

## 2022-10-19 ENCOUNTER — Encounter (HOSPITAL_BASED_OUTPATIENT_CLINIC_OR_DEPARTMENT_OTHER): Payer: Self-pay | Admitting: *Deleted

## 2022-11-14 DIAGNOSIS — G20A1 Parkinson's disease without dyskinesia, without mention of fluctuations: Secondary | ICD-10-CM | POA: Diagnosis not present

## 2022-11-14 DIAGNOSIS — E039 Hypothyroidism, unspecified: Secondary | ICD-10-CM | POA: Diagnosis not present

## 2022-11-14 DIAGNOSIS — F325 Major depressive disorder, single episode, in full remission: Secondary | ICD-10-CM | POA: Diagnosis not present

## 2022-11-14 DIAGNOSIS — M85851 Other specified disorders of bone density and structure, right thigh: Secondary | ICD-10-CM | POA: Diagnosis not present

## 2022-11-14 DIAGNOSIS — Z79899 Other long term (current) drug therapy: Secondary | ICD-10-CM | POA: Diagnosis not present

## 2022-11-14 DIAGNOSIS — F419 Anxiety disorder, unspecified: Secondary | ICD-10-CM | POA: Diagnosis not present

## 2022-11-14 DIAGNOSIS — E7800 Pure hypercholesterolemia, unspecified: Secondary | ICD-10-CM | POA: Diagnosis not present

## 2022-11-14 DIAGNOSIS — Z0000 Encounter for general adult medical examination without abnormal findings: Secondary | ICD-10-CM | POA: Diagnosis not present

## 2022-11-14 DIAGNOSIS — K589 Irritable bowel syndrome without diarrhea: Secondary | ICD-10-CM | POA: Diagnosis not present

## 2022-11-14 DIAGNOSIS — E669 Obesity, unspecified: Secondary | ICD-10-CM | POA: Diagnosis not present

## 2022-11-29 ENCOUNTER — Other Ambulatory Visit: Payer: Self-pay | Admitting: Internal Medicine

## 2022-11-29 DIAGNOSIS — Z79899 Other long term (current) drug therapy: Secondary | ICD-10-CM

## 2022-11-29 DIAGNOSIS — E7800 Pure hypercholesterolemia, unspecified: Secondary | ICD-10-CM

## 2022-12-07 DIAGNOSIS — Z1212 Encounter for screening for malignant neoplasm of rectum: Secondary | ICD-10-CM | POA: Diagnosis not present

## 2022-12-07 DIAGNOSIS — Z1211 Encounter for screening for malignant neoplasm of colon: Secondary | ICD-10-CM | POA: Diagnosis not present

## 2022-12-14 LAB — COLOGUARD: COLOGUARD: NEGATIVE

## 2022-12-26 ENCOUNTER — Other Ambulatory Visit: Payer: Self-pay | Admitting: Neurology

## 2022-12-26 DIAGNOSIS — G20A1 Parkinson's disease without dyskinesia, without mention of fluctuations: Secondary | ICD-10-CM

## 2023-01-04 NOTE — Progress Notes (Unsigned)
Assessment/Plan:   1.  Parkinsons Disease  -Continue carbidopa/levodopa 25/100, 1 tablet at 8 AM/noon/4 PM  -[facility] genetic testing is negative.  -We discussed that it used to be thought that levodopa would increase risk of melanoma but now it is believed that Parkinsons itself likely increases risk of melanoma. she is to get regular skin checks.  She is doing that  2.  Restless leg/arm syndrome  -she is on gabapentin 600 mg but only takes 1/2 of it.  She doesn't want the 300 mg dosage just in case she needs to go back up.   Offered trial of CR levodopa at bedtime (we could also try a small dose of a dopamine agonist) but she really thinks she is doing good enough for now.  She can let me know if she changes her mind.  3.  History of depression  -Feels that she is doing well and wonders if she can taper off of the Wellbutrin.  Discussed with her that most patients with Parkinson's need to be on something for mood and I would not recommend she change it right now, particularly since she plans to have a big move within the next few years and she is nervous about that.  Subjective:   Ashley Henderson was seen today in follow up for Parkinsons disease.  My previous records were reviewed prior to todays visit as well as outside records available to me.  Pt denies falls.  Pt denies lightheadedness, near syncope.  No hallucinations.  Mood has been good. Parkinsons Disease sx's under good control.  "Everything is good."  She asks about going off wellbutrin.  Her pcp has changed and they have not discussed it yet.  She does state that her restless leg is doing fairly good and she has tried to decrease the gabapentin.  She really does not want a lower dose though in case she decides to go back up on it.  Current prescribed movement disorder medications: Carbidopa/levodopa 25/100, 1 tablet 3 times per day (told her to take it at 8 AM/noon/4 PM -previously waking up in the middle of the night to take  it) Gabapentin, 600 mg at bedtime (she is only taking 300 mg of this)   ALLERGIES:   Allergies  Allergen Reactions   Belviq [Lorcaserin] Other (See Comments)    drowsiness   Other     antihistamines   Biaxin [Clarithromycin] Rash   Sulfa Antibiotics Rash    CURRENT MEDICATIONS:  Outpatient Encounter Medications as of 01/05/2023  Medication Sig   buPROPion (WELLBUTRIN XL) 300 MG 24 hr tablet Take 1 tablet by mouth daily.   carbidopa-levodopa (SINEMET IR) 25-100 MG tablet TAKE ONE TABLET THREE TIMES DAILY 8AM/ NOON/4PM   clobetasol ointment (TEMOVATE) 0.05 % APPLY TWICE DAILY--CAN USE FOR UP TO 7 DAYs   dicyclomine (BENTYL) 20 MG tablet Take 20 mg by mouth every 6 (six) hours. Takes as needed   Docusate Calcium (STOOL SOFTENER PO) Take 1 tablet by mouth daily.    FIBER PO Take by mouth 2 (two) times daily.   fluticasone (FLONASE) 50 MCG/ACT nasal spray Place 2 sprays into the nose as needed.   gabapentin (NEURONTIN) 600 MG tablet TAKE ONE TABLET AT BEDTIME   levothyroxine (SYNTHROID, LEVOTHROID) 75 MCG tablet Take 1 tablet by mouth every other day.   levothyroxine (SYNTHROID, LEVOTHROID) 88 MCG tablet Take 88 mcg by mouth every other day.    loratadine (CLARITIN) 10 MG tablet Take 10 mg by  mouth daily as needed for allergies.   Magnesium 400 MG CAPS Take 1 capsule by mouth daily. With food   meclizine (ANTIVERT) 12.5 MG tablet Take 12.5 mg by mouth 3 (three) times daily as needed for dizziness.   meloxicam (MOBIC) 15 MG tablet Take 15 mg by mouth daily.   Probiotic Product (ALIGN) 4 MG CAPS Take 1 tablet by mouth daily.   valACYclovir (VALTREX) 500 MG tablet Take 1 tablet (500 mg total) by mouth 2 (two) times daily. Take one tablet twice daily at onset of symptoms for 3 days.   Vitamin D, Ergocalciferol, (DRISDOL) 1.25 MG (50000 UNIT) CAPS capsule Take 50,000 Units by mouth once a week.   No facility-administered encounter medications on file as of 01/05/2023.    Objective:    PHYSICAL EXAMINATION:    VITALS:   Vitals:   01/05/23 1526  BP: 131/82  Pulse: 78  SpO2: 98%  Weight: 185 lb 6.4 oz (84.1 kg)  Height: '5\' 3"'$  (1.6 m)   Wt Readings from Last 3 Encounters:  01/05/23 185 lb 6.4 oz (84.1 kg)  07/05/22 182 lb (82.6 kg)  06/20/22 183 lb 3.2 oz (83.1 kg)      GEN:  The patient appears stated age and is in NAD. HEENT:  Normocephalic, atraumatic.  The mucous membranes are moist. The superficial temporal arteries are without ropiness or tenderness. CV:  RRR Lungs:  CTAB Neck/HEME:  There are no carotid bruits bilaterally.  Neurological examination:  Orientation: The patient is alert and oriented x3. Cranial nerves: There is good facial symmetry without facial hypomimia. The speech is fluent and clear. Soft palate rises symmetrically and there is no tongue deviation. Hearing is intact to conversational tone. Sensation: Sensation is intact to light touch throughout Motor: Strength is at least antigravity x4.  Movement examination: Tone: There is nl tone in the UE/LE Abnormal movements: there is RUE rest tremor only with ambulation (same as last visit) Coordination:  There is decremation only with finger taps on the R.  All other RAMs are normal Gait and Station: The patient has no difficulty arising out of a deep-seated chair without the use of the hands. The patient's stride length is good.      Total time spent on today's visit was 25 minutes, including both face-to-face time and nonface-to-face time.  Time included that spent on review of records (prior notes available to me/labs/imaging if pertinent), discussing treatment and goals, answering patient's questions and coordinating care.   Cc:  Charlane Ferretti, MD

## 2023-01-05 ENCOUNTER — Ambulatory Visit: Payer: Medicare PPO | Admitting: Neurology

## 2023-01-05 ENCOUNTER — Encounter: Payer: Self-pay | Admitting: Neurology

## 2023-01-05 VITALS — BP 131/82 | HR 78 | Ht 63.0 in | Wt 185.4 lb

## 2023-01-05 DIAGNOSIS — G20A1 Parkinson's disease without dyskinesia, without mention of fluctuations: Secondary | ICD-10-CM

## 2023-01-05 NOTE — Patient Instructions (Signed)
Local and Online Resources for Power over Parkinson's Group  January 2024   LOCAL [place] PARKINSON'S GROUPS   Power over Parkinson's Group:    Power Over Parkinson's Patient Education Group will be Wednesday, January 10th-*Hybrid meting*- in person at [facility] location and via [facility], 2:00-3:00 pm.   Starting in November, Power over [employer] and Care Partner Groups will meet together, with plans for separate break out session for caregivers (*this will be evolving over the next few months) Upcoming Power over Parkinson's Meetings/Care Partner Support:  2nd Wednesdays of the month at 2 pm:   January 10th, February 14th [place] at [email or web address] if interested in participating in this group    [place]! Moves Dynegy Instructor-Led Classes offering at [employer]!  TUESDAYS and Wednesdays 1-2 pm.   Contact Vonna Kotyk at  [email or web address]  or [phone] (Tuesday classes are modified for chair and standing only) Drumming for Parkinson's will be held on 2nd and 4th Mondays at 11:00 am.   Located at the [place] ([place]  Contact Doylene Canning at [email or web address] or [place] Class, Mondays at 11 am.  Call [phone] for details Let's Try Pickleball-$25 for 6 weeks of Pickleball in [place].  Contact Corwin Levins for more details and for dates.  [email or web address] SAVE THE DATE and REGISTER:  [place] Chapter of Parkinson's Foundation:  Parkinson's Symposium.  Conversations about Parkinson's.  Saturday, February 11, 2023, 9:00 am-2:00 pm.  [place], *In person or online via [place]*.  Register at [email or web address] or call Beverlee Nims at [phone].   [place]:  [email or web address]  PD Health at Home continues:   Mindfulness Mondays, Wellness Wednesdays, Fitness Fridays   Upcoming Education:    Environmental health practitioner your Voice:  Air cabin crew. Wednesday, January 3rd,  1-2 pm  Managing Weight Loss & Retaining Muscle Mass.  Wednesday, Jan 10th, 1-2 pm Changes in Speech and Voice.  Wednesday, January 17th, 1-2 pm Register for virtual education and Patent attorney (webinars) at [email or web address].pl Please check out their website to sign up for emails and see their full online offerings      [place]:  [email or web address]   Third Thursday Webinars:  On the third Thursday of every month at 12 p.m. ET, join our free live webinars to learn about various aspects of living with Parkinson's disease and our work to speed medical breakthroughs.  Upcoming Webinar:  New Year, New Moves!  Explore Exercise for Life with Parkinson's.  Thursday, January 18th at 12 noon. Check out additional information on their website to see their full online offerings    [facility]:  [email or web address]  Upcoming Webinar:   Physical Therapy and Parkinson's.  Thursday, January 11th, 2 pm  Webinar Series:  Living with Parkinson's Meetup.   Third Thursdays each month, 3 pm  Care Partner Monthly Meetup.  With Robin Searing Phinney.  First Tuesday of each month, 2 pm  Check out additional information to Live Well Today on their website    Parkinson and Movement Disorders (PMD) Alliance:  www.[email or web address]  [employer] Online:  Online Education Events  Sign up for emails, which are sent weekly to give you updates on programming and online offerings    Parkinson's Association of the [place]:  www.[email or web address]  Information on online support groups, education events, and online exercises including Yoga, Parkinson's exercises  and more-LOTS of information on links to PD resources and online events  Virtual Support Group through  Parkinson's Association of the [place]; next one is scheduled for Wednesday, Feb 7th  MOVEMENT AND EXERCISE OPPORTUNITIES  PWR! Moves Classes at [place].  Wednesdays 10 and 11 am.   Contact Amy Marriott, PT [email or web address] if interested.  NEW PWR! Moves Class offerings at [employer].  *TUESDAYS* and Wednesdays 1-2 pm.    Contact Vonna Kotyk at  [email or web address]    Parkinson's Wellness Recovery (PWR! Moves)  www.[email or web address]  Info on the PWR! Virtual Experience:  You will have access to our expertise?through self-assessment, guided plans that start with the PD-specific fundamentals, educational content, tips, Q&A with an expert, and a growing Art therapist of PD-specific pre-recorded and live exercise classes of varying types and intensity - both physical and cognitive! If that is not enough, we offer 1:1 wellness consultations (in-person or virtual) to personalize your PWR! Research scientist (medical).   [place] Fridays:   As part of the PD Health @ Home program, this free video series focuses each week on one aspect of fitness designed to support people living with Parkinson's.? These weekly videos highlight the Jenkins fitness guidelines for people with Parkinson's disease.  ModemGamers.[email or web address]   Dance for PD website is offering free, live-stream classes throughout the week, as well as links to [employer] of classes:  [email or web address]  Virtual dance and Pilates for Parkinson's classes: Click on the Community Tab> Parkinson's Movement Initiative Tab.  To register for classes and for more information, visit [email or web address] and click the "community" tab.   [employer] Parkinson's Cycling Classes   [employer]:  Thursdays @ Noon-Live classes at [employer] ([employer] at [email or web address]?or [phone])  [employer]: Virtual  Classes Mondays and Thursdays Jeanette Caprice classes Tuesday, Wednesday and Thursday (contact [place] at [email or web address] ?or [phone])  [place]  Varied levels of classes are offered Tuesdays and Thursdays at [employer].   Stretching with Verdis Frederickson weekly class is also offered for people with Parkinson's  To observe a class or for more information, call [phone] or email Hezzie Bump at [email or web address]   ADDITIONAL SUPPORT AND RESOURCES  [employer]:Online Caregiver Education Opportunities:  www.well-[email or web address]/caregiver-education/[email or web address]-support-group.  You may also contact Vickki Muff at [email or web address] or [phone].     Well-Spring Navigator:  Just1Navigator program, a?free service to help individuals and families through the journey of determining care for older adults.  The "Navigator" is a [phone], Education officer, museum, who will speak with a prospective client and/or loved ones to provide an assessment of the situation and a set of recommendations for a personalized care plan -- all free of charge, and whether?Well[employer]Spring [employer] offers the needed service or not. If the need is not a service we provide, we are well-connected with reputable programs in town that we can refer you to.  www.well-springsolutions.org or to speak with the Navigator, call [phone].

## 2023-01-06 ENCOUNTER — Ambulatory Visit
Admission: RE | Admit: 2023-01-06 | Discharge: 2023-01-06 | Disposition: A | Discharge status: Home / Self Care | Payer: Medicare PPO | Source: Ambulatory Visit | Attending: Internal Medicine | Admitting: Internal Medicine

## 2023-01-06 DIAGNOSIS — E7800 Pure hypercholesterolemia, unspecified: Secondary | ICD-10-CM

## 2023-01-06 DIAGNOSIS — Z79899 Other long term (current) drug therapy: Secondary | ICD-10-CM

## 2023-01-17 ENCOUNTER — Other Ambulatory Visit: Payer: Self-pay | Admitting: Neurology

## 2023-02-23 DIAGNOSIS — H40013 Open angle with borderline findings, low risk, bilateral: Secondary | ICD-10-CM | POA: Diagnosis not present

## 2023-02-27 DIAGNOSIS — I8392 Asymptomatic varicose veins of left lower extremity: Secondary | ICD-10-CM | POA: Diagnosis not present

## 2023-03-01 ENCOUNTER — Ambulatory Visit: Payer: Medicare PPO | Admitting: Vascular Surgery

## 2023-03-01 ENCOUNTER — Encounter: Payer: Self-pay | Admitting: Vascular Surgery

## 2023-03-01 VITALS — BP 126/75 | HR 70 | Temp 98.4°F | Resp 14 | Ht 63.0 in | Wt 182.0 lb

## 2023-03-01 DIAGNOSIS — I83812 Varicose veins of left lower extremities with pain: Secondary | ICD-10-CM

## 2023-03-01 DIAGNOSIS — I8393 Asymptomatic varicose veins of bilateral lower extremities: Secondary | ICD-10-CM

## 2023-03-01 NOTE — Progress Notes (Signed)
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  She has CEAP C1 disease (telangiectasias reticular veins).  We have discussed importance of intermittent leg elevation and the proper positioning for this.  I encouraged her to avoid prolonged sitting and standing.  We discussed importance of exercise specifically walking and water aerobics.  We also discussed importance of maintaining a healthy weight.  Since her pain is seems to be associated with the reticular veins along the lateral aspect of her left thigh and leg I think she could benefit from sclerotherapy.  Estell Harpin, RN has looked at her veins and agrees that she is a good candidate.  The patient will call to schedule this.  If her symptoms progress or she develops worsening varicosities then certainly we can get formal venous reflux testing.  REASON FOR CONSULT:    Varicose veins of the left lower extremity.  The consult is requested by Dr. Thayer Ohm.   HPI:   Ashley Henderson is a 82 y.o. female who is referred with a painful varicose vein of the left lower extremity.  She states that she gets some stabbing pain occasionally in burning pain associated with a varicosity along the lateral aspect of her left thigh and left leg.  She has had this for several months.  She denies any previous history of DVT.  She has had no previous venous procedures.  She does not wear compression stockings.  She has not been elevating her legs.  She has no symptoms on the right side.  She denies any history of claudication or rest pain.  Past Medical History:  Diagnosis Date   Abnormal uterine bleeding (AUB)    endo biopsy negative   ADD (attention deficit disorder)    Allergic rhinitis    Anxiety    Bouchard nodes (DJD hand)    Claustrophobia    Colon polyp    Cystic acne    Depression    Diverticulosis    Glaucoma    Goiter    Hematuria 1994   negative cysto, IVP   Hemorrhoids    Hyperlipidemia     Hyperplastic colon polyp    Hypothyroidism    IBS (irritable bowel syndrome)    using align-saw Dr Olevia Perches in 2014   Obesity    Parkinson disease    Plantar fasciitis    Restless leg syndrome    Vertigo     Family History  Problem Relation Age of Onset   Breast cancer Mother    Bone cancer Mother    Diabetes Mother    Heart attack Father    Parkinson's disease Father    Alzheimer's disease Father    CAD Brother    Hypertension Brother    Breast cancer Maternal Grandmother    Breast cancer Maternal Aunt    Breast cancer Paternal Aunt    Cancer - Colon Paternal Uncle    Colon cancer Neg Hx     SOCIAL HISTORY: Social History   Tobacco Use   Smoking status: Never   Smokeless tobacco: Never  Substance Use Topics   Alcohol use: Not Currently    Allergies  Allergen Reactions   Belviq [Lorcaserin] Other (See Comments)    drowsiness   Other     antihistamines   Biaxin [Clarithromycin] Rash   Sulfa Antibiotics Rash    Current Outpatient Medications  Medication Sig Dispense Refill   buPROPion (WELLBUTRIN XL) 300 MG 24 hr tablet Take 1  tablet by mouth daily.     carbidopa-levodopa (SINEMET IR) 25-100 MG tablet TAKE ONE TABLET THREE TIMES DAILY 8AM/ NOON/4PM 270 tablet 0   clobetasol ointment (TEMOVATE) 0.05 % APPLY TWICE DAILY--CAN USE FOR UP TO 7 DAYs 60 g 1   dicyclomine (BENTYL) 20 MG tablet Take 20 mg by mouth every 6 (six) hours. Takes as needed     FIBER PO Take by mouth 2 (two) times daily.     fluticasone (FLONASE) 50 MCG/ACT nasal spray Place 2 sprays into the nose as needed.     gabapentin (NEURONTIN) 600 MG tablet TAKE ONE TABLET AT BEDTIME 90 tablet 0   levothyroxine (SYNTHROID, LEVOTHROID) 75 MCG tablet Take 1 tablet by mouth every other day.     levothyroxine (SYNTHROID, LEVOTHROID) 88 MCG tablet Take 88 mcg by mouth every other day.      loratadine (CLARITIN) 10 MG tablet Take 10 mg by mouth daily as needed for allergies.     Magnesium 400 MG CAPS Take 1  capsule by mouth daily. With food     meclizine (ANTIVERT) 12.5 MG tablet Take 12.5 mg by mouth 3 (three) times daily as needed for dizziness.     meloxicam (MOBIC) 15 MG tablet Take 15 mg by mouth daily.     Probiotic Product (ALIGN) 4 MG CAPS Take 1 tablet by mouth daily.     valACYclovir (VALTREX) 500 MG tablet Take 1 tablet (500 mg total) by mouth 2 (two) times daily. Take one tablet twice daily at onset of symptoms for 3 days. 6 tablet 3   Vitamin D, Ergocalciferol, (DRISDOL) 1.25 MG (50000 UNIT) CAPS capsule Take 50,000 Units by mouth once a week.     Docusate Calcium (STOOL SOFTENER PO) Take 1 tablet by mouth daily.  (Patient not taking: Reported on 03/01/2023)     No current facility-administered medications for this visit.    REVIEW OF SYSTEMS:  [X]  denotes positive finding, [ ]  denotes negative finding Cardiac  Comments:  Chest pain or chest pressure:    Shortness of breath upon exertion:    Short of breath when lying flat:    Irregular heart rhythm:        Vascular    Pain in calf, thigh, or hip brought on by ambulation:    Pain in feet at night that wakes you up from your sleep:     Blood clot in your veins:    Leg swelling:         Pulmonary    Oxygen at home:    Productive cough:     Wheezing:         Neurologic    Sudden weakness in arms or legs:     Sudden numbness in arms or legs:     Sudden onset of difficulty speaking or slurred speech:    Temporary loss of vision in one eye:     Problems with dizziness:         Gastrointestinal    Blood in stool:     Vomited blood:         Genitourinary    Burning when urinating:     Blood in urine:        Psychiatric    Major depression:         Hematologic    Bleeding problems:    Problems with blood clotting too easily:        Skin    Rashes or ulcers:  Constitutional    Fever or chills:    -  PHYSICAL EXAM:   Vitals:   03/01/23 1119  BP: 126/75  Pulse: 70  Resp: 14  Temp: 98.4 F (36.9 C)   TempSrc: Temporal  SpO2: 98%  Weight: 182 lb (82.6 kg)  Height: 5\' 3"  (1.6 m)   Body mass index is 32.24 kg/m. GENERAL: The patient is a well-nourished female, in no acute distress. The vital signs are documented above. CARDIAC: There is a regular rate and rhythm.  VASCULAR: I do not detect carotid bruits. She has palpable dorsalis pedis pulses bilaterally. She has some small telangiectasias and also reticular vein along the lateral aspect of her left thigh and left leg as documented below. I did look at her left great saphenous vein myself with the SonoSite and the vein was not especially dilated.  The largest diameter I got was about 4 mm in the proximal thigh. PULMONARY: There is good air exchange bilaterally without wheezing or rales. ABDOMEN: Soft and non-tender with normal pitched bowel sounds.  MUSCULOSKELETAL: There are no major deformities. NEUROLOGIC: No focal weakness or paresthesias are detected. SKIN: There are no ulcers or rashes noted. PSYCHIATRIC: The patient has a normal affect.  DATA:    The patient did not have formal venous reflux testing.  Deitra Mayo [facility]

## 2023-03-27 ENCOUNTER — Other Ambulatory Visit: Payer: Self-pay | Admitting: Neurology

## 2023-03-27 DIAGNOSIS — G20A1 Parkinson's disease without dyskinesia, without mention of fluctuations: Secondary | ICD-10-CM

## 2023-03-28 ENCOUNTER — Ambulatory Visit (INDEPENDENT_AMBULATORY_CARE_PROVIDER_SITE_OTHER): Payer: Medicare PPO

## 2023-03-28 DIAGNOSIS — I83899 Varicose veins of unspecified lower extremities with other complications: Secondary | ICD-10-CM

## 2023-03-28 NOTE — Progress Notes (Signed)
Treated pt's LLE reticular vein with Asclera 1 % administered with a 27g butterfly.  Patient received a total of 2 mL/20 mg. Pt tolerated well. Easy access. We discussed the likely need for another treatment. She is hoping this will help with the discomfort she has been experiencing in the area of the vein. Pt was given post treatment care instructions on handout and verbally. She will call if she has any questions/concerns.   Photos: Yes.    Compression stockings applied: Yes.

## 2023-03-30 DIAGNOSIS — L821 Other seborrheic keratosis: Secondary | ICD-10-CM | POA: Diagnosis not present

## 2023-03-30 DIAGNOSIS — L218 Other seborrheic dermatitis: Secondary | ICD-10-CM | POA: Diagnosis not present

## 2023-03-30 DIAGNOSIS — L738 Other specified follicular disorders: Secondary | ICD-10-CM | POA: Diagnosis not present

## 2023-03-30 DIAGNOSIS — D1801 Hemangioma of skin and subcutaneous tissue: Secondary | ICD-10-CM | POA: Diagnosis not present

## 2023-03-30 DIAGNOSIS — Z85828 Personal history of other malignant neoplasm of skin: Secondary | ICD-10-CM | POA: Diagnosis not present

## 2023-03-30 DIAGNOSIS — L603 Nail dystrophy: Secondary | ICD-10-CM | POA: Diagnosis not present

## 2023-03-30 DIAGNOSIS — L718 Other rosacea: Secondary | ICD-10-CM | POA: Diagnosis not present

## 2023-06-07 DIAGNOSIS — H8111 Benign paroxysmal vertigo, right ear: Secondary | ICD-10-CM | POA: Diagnosis not present

## 2023-06-17 ENCOUNTER — Other Ambulatory Visit: Payer: Self-pay | Admitting: Neurology

## 2023-06-26 ENCOUNTER — Ambulatory Visit (HOSPITAL_BASED_OUTPATIENT_CLINIC_OR_DEPARTMENT_OTHER): Payer: Medicare PPO | Admitting: Obstetrics & Gynecology

## 2023-06-26 ENCOUNTER — Other Ambulatory Visit: Payer: Self-pay | Admitting: Neurology

## 2023-06-26 DIAGNOSIS — G20A1 Parkinson's disease without dyskinesia, without mention of fluctuations: Secondary | ICD-10-CM

## 2023-07-13 ENCOUNTER — Ambulatory Visit (INDEPENDENT_AMBULATORY_CARE_PROVIDER_SITE_OTHER): Payer: Medicare PPO | Admitting: Student

## 2023-07-13 ENCOUNTER — Encounter (HOSPITAL_BASED_OUTPATIENT_CLINIC_OR_DEPARTMENT_OTHER): Payer: Self-pay | Admitting: Student

## 2023-07-13 VITALS — BP 139/75 | HR 68 | Ht 63.5 in | Wt 183.0 lb

## 2023-07-13 DIAGNOSIS — Z803 Family history of malignant neoplasm of breast: Secondary | ICD-10-CM

## 2023-07-13 DIAGNOSIS — Z780 Asymptomatic menopausal state: Secondary | ICD-10-CM | POA: Diagnosis not present

## 2023-07-13 DIAGNOSIS — A6000 Herpesviral infection of urogenital system, unspecified: Secondary | ICD-10-CM | POA: Diagnosis not present

## 2023-07-13 DIAGNOSIS — Z01419 Encounter for gynecological examination (general) (routine) without abnormal findings: Secondary | ICD-10-CM

## 2023-07-13 DIAGNOSIS — L280 Lichen simplex chronicus: Secondary | ICD-10-CM

## 2023-07-13 NOTE — Progress Notes (Signed)
ANNUAL EXAM Patient name: Ashley Henderson MRN [id number]  Date of birth: 10-25-1941 Chief Complaint:   No chief complaint on file.  History of Present Illness:   Ashley Henderson is a 82 y.o. G0P0000 Caucasian female being seen today for a routine annual exam.  Current complaints: none  Patient's last menstrual period was 12/13/1995.   Sexually active: No.   Last pap 2019. Results were: NILM w/ HRHPV not done. H/O abnormal pap: no Last mammogram: 09/2022. Results were: normal. Family h/o breast cancer: yes mother and maternal grandmother. Patient has had double mastectomy  Last colonoscopy: 2014. Results were: normal. Family h/o colorectal cancer: no - Patient's husband had colon cancer and makes patient concerned for her own health outcomes Last DEXA 2020 : Low bone density, 2 year follow-up recommended     06/20/2022    2:04 PM 06/09/2021   10:55 AM  Depression screen PHQ 2/9  Decreased Interest 0 1  Down, Depressed, Hopeless 0 1  PHQ - 2 Score 0 2  Altered sleeping  0  Tired, decreased energy  1  Change in appetite  1  Feeling bad or failure about yourself   0  Trouble concentrating  1  Moving slowly or fidgety/restless  0  Suicidal thoughts  0  PHQ-9 Score  5         No data to display           Review of Systems:   Pertinent items are noted in HPI Denies any headaches, blurred vision, fatigue, shortness of breath, chest pain, abdominal pain, abnormal vaginal discharge/itching/odor/irritation, problems with periods, bowel movements, urination, or intercourse unless otherwise stated above. Pertinent History Reviewed:  Reviewed past medical,surgical, social and family history.  Reviewed problem list, medications and allergies. Physical Assessment:  There were no vitals filed for this visit.There is no height or weight on file to calculate BMI.        Physical Examination:   General appearance - well appearing, and in no distress  Mental status - alert,  oriented to person, place, and time, + vertigo  Psych:  She has a normal mood and affect  Skin - warm and dry, normal color, no suspicious lesions noted  Chest - effort normal, all lung fields clear to auscultation bilaterally  Heart - normal rate and regular rhythm  Neck:  midline trachea, no thyromegaly or nodules  Breasts - breasts appear normal, no suspicious masses, no skin or nipple changes or  axillary nodes, Healed incisions notable bilaterally   Abdomen - soft, nontender, nondistended, no masses or organomegaly  Pelvic - VULVA: normal appearing vulva with no masses, tenderness or lesions  VAGINA: normal appearing vagina with normal color and discharge for patient age, no lesions  CERVIX: normal appearing cervix without discharge or lesions, no CMT  Thin prep pap is not done   UTERUS: uterus is felt to be normal size, shape, consistency and nontender   ADNEXA: No adnexal masses or tenderness noted.  Extremities:  No swelling or varicosities noted, +weakness  Chaperone present for exam  No results found for this or any previous visit (from the past 24 hour(s)).  Assessment & Plan:  1. 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 Family history of breast cancer - h/o bilateral simple mastectomy in 1984 - h/o negative genetic testing  4. Genital herpes simplex, unspecified  site - no recent outbreaks  5. Postmenopausal - no HRT   Labs/procedures today:   Mammogram: schedule screening mammo as soon as possible, or sooner if problems Colonoscopy: schedule screening colonoscopy as soon as possible, or sooner if problems  No orders of the defined types were placed in this encounter.   Meds: No orders of the defined types were placed in this encounter.   Follow-up: No follow-ups on file.  Corlis Hove,  NP 07/13/2023 11:21 AM

## 2023-07-18 NOTE — Progress Notes (Deleted)
Assessment/Plan:   1.  Parkinsons Disease  -Continue carbidopa/levodopa 25/100, 1 tablet at 8 AM/noon/4 PM  -[facility] genetic testing is negative.  -We discussed that it used to be thought that levodopa would increase risk of melanoma but now it is believed that Parkinsons itself likely increases risk of melanoma. she is to get regular skin checks.  She is doing that  2.  Restless leg/arm syndrome  -she is on gabapentin 600 mg but only takes 1/2 of it.  She doesn't want the 300 mg dosage just in case she needs to go back up.   Offered trial of CR levodopa at bedtime (we could also try a small dose of a dopamine agonist) but she really thinks she is doing good enough for now.  She can let me know if she changes her mind.  3.  History of depression  -Feels that she is doing well and wonders if she can taper off of the Wellbutrin.  Discussed with her that most patients with Parkinson's need to be on something for mood and I would not recommend she change it right now, particularly since she plans to have a big move within the next few years and she is nervous about that.  Subjective:   Ashley Henderson was seen today in follow up for Parkinsons disease.  My previous records were reviewed prior to todays visit as well as outside records available to me.  She is doing fairly well from a Parkinson's standpoint.  She has had no falls, lightheadedness or near syncope.  She is taking her levodopa.  She has had no hallucinations.  Current prescribed movement disorder medications: Carbidopa/levodopa 25/100, 1 tablet 3 times per day  Gabapentin, 600 mg at bedtime (she is only taking 300 mg of this)   ALLERGIES:   Allergies  Allergen Reactions   Belviq [Lorcaserin] Other (See Comments)    drowsiness   Other     antihistamines   Biaxin [Clarithromycin] Rash   Sulfa Antibiotics Rash    CURRENT MEDICATIONS:  Outpatient Encounter Medications as of 07/20/2023  Medication Sig   buPROPion  (WELLBUTRIN XL) 300 MG 24 hr tablet Take 1 tablet by mouth daily.   carbidopa-levodopa (SINEMET IR) 25-100 MG tablet TAKE ONE TABLET THREE TIMES DAILY 8AM/ NOON/4PM   clobetasol ointment (TEMOVATE) 0.05 % APPLY TWICE DAILY--CAN USE FOR UP TO 7 DAYs   dicyclomine (BENTYL) 20 MG tablet Take 20 mg by mouth every 6 (six) hours. Takes as needed   Docusate Calcium (STOOL SOFTENER PO) Take 1 tablet by mouth daily.   FIBER PO Take by mouth 2 (two) times daily.   fluticasone (FLONASE) 50 MCG/ACT nasal spray Place 2 sprays into the nose as needed.   gabapentin (NEURONTIN) 600 MG tablet TAKE ONE TABLET AT BEDTIME   levothyroxine (SYNTHROID, LEVOTHROID) 75 MCG tablet Take 1 tablet by mouth every other day.   levothyroxine (SYNTHROID, LEVOTHROID) 88 MCG tablet Take 88 mcg by mouth every other day.    loratadine (CLARITIN) 10 MG tablet Take 10 mg by mouth daily as needed for allergies.   Magnesium 400 MG CAPS Take 1 capsule by mouth daily. With food   meclizine (ANTIVERT) 12.5 MG tablet Take 12.5 mg by mouth 3 (three) times daily as needed for dizziness.   meloxicam (MOBIC) 15 MG tablet Take 15 mg by mouth daily.   Probiotic Product (ALIGN) 4 MG CAPS Take 1 tablet by mouth daily.   valACYclovir (VALTREX) 500 MG tablet Take 1  tablet (500 mg total) by mouth 2 (two) times daily. Take one tablet twice daily at onset of symptoms for 3 days.   Vitamin D, Ergocalciferol, (DRISDOL) 1.25 MG (50000 UNIT) CAPS capsule Take 50,000 Units by mouth once a week.   No facility-administered encounter medications on file as of 07/20/2023.    Objective:   PHYSICAL EXAMINATION:    VITALS:   There were no vitals filed for this visit.  Wt Readings from Last 3 Encounters:  07/13/23 183 lb (83 kg)  03/01/23 182 lb (82.6 kg)  01/05/23 185 lb 6.4 oz (84.1 kg)      GEN:  The patient appears stated age and is in NAD. HEENT:  Normocephalic, atraumatic.  The mucous membranes are moist. The superficial temporal arteries are  without ropiness or tenderness. CV:  RRR Lungs:  CTAB Neck/HEME:  There are no carotid bruits bilaterally.  Neurological examination:  Orientation: The patient is alert and oriented x3. Cranial nerves: There is good facial symmetry without facial hypomimia. The speech is fluent and clear. Soft palate rises symmetrically and there is no tongue deviation. Hearing is intact to conversational tone. Sensation: Sensation is intact to light touch throughout Motor: Strength is at least antigravity x4.  Movement examination: Tone: There is nl tone in the UE/LE Abnormal movements: there is RUE rest tremor only with ambulation (same as last visit) Coordination:  There is decremation only with finger taps on the R.  All other RAMs are normal Gait and Station: The patient has no difficulty arising out of a deep-seated chair without the use of the hands. The patient's stride length is good.    Total time spent on today's visit was *** minutes, including both face-to-face time and nonface-to-face time.  Time included that spent on review of records (prior notes available to me/labs/imaging if pertinent), discussing treatment and goals, answering patient's questions and coordinating care.   Cc:  Thana Ates, MD

## 2023-07-20 ENCOUNTER — Ambulatory Visit: Payer: Medicare PPO | Admitting: Neurology

## 2023-07-20 ENCOUNTER — Telehealth (INDEPENDENT_AMBULATORY_CARE_PROVIDER_SITE_OTHER): Payer: Medicare PPO | Admitting: Neurology

## 2023-07-20 DIAGNOSIS — G2581 Restless legs syndrome: Secondary | ICD-10-CM | POA: Diagnosis not present

## 2023-07-20 DIAGNOSIS — G20A1 Parkinson's disease without dyskinesia, without mention of fluctuations: Secondary | ICD-10-CM

## 2023-07-20 DIAGNOSIS — Z8659 Personal history of other mental and behavioral disorders: Secondary | ICD-10-CM

## 2023-07-20 MED ORDER — BUPROPION HCL ER (XL) 300 MG PO TB24: 300.0000 mg | ORAL_TABLET | Freq: Every day | ORAL | 1 refills | Status: DC

## 2023-07-20 NOTE — Patient Instructions (Signed)
 SAVE THE DATE!  We are planning a Parkinsons Disease educational symposium at [facility] in [place] on October 11.  More details to come!  If you would like to be added to our email list to get further information, email [email or web address].  We hope to see you there!

## 2023-07-20 NOTE — Progress Notes (Signed)
Virtual Visit Via Video       Consent was obtained for video visit:  Yes.   Answered questions that patient had about telehealth interaction:  Yes.   I discussed the limitations, risks, security and privacy concerns of performing an evaluation and management service by telemedicine. I also discussed with the patient that there may be a patient responsible charge related to this service. The patient expressed understanding and agreed to proceed.  Pt location: Home Physician Location: office Name of referring provider:  Thana Ates, MD I connected with Ashley Henderson at patients initiation/request on 07/20/2023 at  1:00 PM EDT by video enabled telemedicine application and verified that I am speaking with the correct person using two identifiers. Pt MRN:  [id number] Pt DOB:  [birth year] Video Participants:  Ashley Henderson;    Assessment/Plan:   1.  Parkinsons Disease  -Continue carbidopa/levodopa 25/100, 1 tablet at 8 AM/noon/4 PM  -[facility] genetic testing is negative.  -We discussed that it used to be thought that levodopa would increase risk of melanoma but now it is believed that Parkinsons itself likely increases risk of melanoma. she is to get regular skin checks.  She is doing that  -info given on Parkinsons Disease symposium  2.  Restless leg/arm syndrome  -she is on gabapentin 600 mg but only takes 1/2 of it.  She doesn't want the 300 mg dosage just in case she needs to go back up.   Offered trial of CR levodopa at bedtime (we could also try a small dose of a dopamine agonist) but she really thinks she is doing good enough for now.  She can let me know if she changes her mind.  3.  History of depression  -She wanted me to start RX and I had no objection as she needed to change to 90 day supply.  She worries a lot about her mood if/when her dog passes away.  Subjective:   Ashley Henderson was seen today in follow up for Parkinsons disease.  My previous records were reviewed  prior to todays visit as well as outside records available to me.  She is doing fairly well from a Parkinson's standpoint.  She has had no falls, lightheadedness or near syncope.  She feels a little more clumsy but no falls.  She is still boxing with her trainer 2 days per week.   She is taking her levodopa.  She has had no hallucinations.  Current prescribed movement disorder medications: Carbidopa/levodopa 25/100, 1 tablet 3 times per day  Gabapentin, 600 mg at bedtime (she is only taking 300 mg of this)   ALLERGIES:   Allergies  Allergen Reactions   Belviq [Lorcaserin] Other (See Comments)    drowsiness   Other     antihistamines   Biaxin [Clarithromycin] Rash   Sulfa Antibiotics Rash    CURRENT MEDICATIONS:  Outpatient Encounter Medications as of 07/20/2023  Medication Sig   buPROPion (WELLBUTRIN XL) 300 MG 24 hr tablet Take 1 tablet by mouth daily.   carbidopa-levodopa (SINEMET IR) 25-100 MG tablet TAKE ONE TABLET THREE TIMES DAILY 8AM/ NOON/4PM   clobetasol ointment (TEMOVATE) 0.05 % APPLY TWICE DAILY--CAN USE FOR UP TO 7 DAYs   dicyclomine (BENTYL) 20 MG tablet Take 20 mg by mouth every 6 (six) hours. Takes as needed   Docusate Calcium (STOOL SOFTENER PO) Take 1 tablet by mouth daily.   FIBER PO Take by mouth 2 (two) times daily.   fluticasone (FLONASE) 50  MCG/ACT nasal spray Place 2 sprays into the nose as needed.   gabapentin (NEURONTIN) 600 MG tablet TAKE ONE TABLET AT BEDTIME   levothyroxine (SYNTHROID, LEVOTHROID) 75 MCG tablet Take 1 tablet by mouth every other day.   levothyroxine (SYNTHROID, LEVOTHROID) 88 MCG tablet Take 88 mcg by mouth every other day.    loratadine (CLARITIN) 10 MG tablet Take 10 mg by mouth daily as needed for allergies.   Magnesium 400 MG CAPS Take 1 capsule by mouth daily. With food   meclizine (ANTIVERT) 12.5 MG tablet Take 12.5 mg by mouth 3 (three) times daily as needed for dizziness.   meloxicam (MOBIC) 15 MG tablet Take 15 mg by mouth daily.    Probiotic Product (ALIGN) 4 MG CAPS Take 1 tablet by mouth daily.   valACYclovir (VALTREX) 500 MG tablet Take 1 tablet (500 mg total) by mouth 2 (two) times daily. Take one tablet twice daily at onset of symptoms for 3 days.   Vitamin D, Ergocalciferol, (DRISDOL) 1.25 MG (50000 UNIT) CAPS capsule Take 50,000 Units by mouth once a week.   No facility-administered encounter medications on file as of 07/20/2023.    Objective:   PHYSICAL EXAMINATION:    VITALS:   There were no vitals filed for this visit.  Wt Readings from Last 3 Encounters:  07/13/23 183 lb (83 kg)  03/01/23 182 lb (82.6 kg)  01/05/23 185 lb 6.4 oz (84.1 kg)      GEN:  The patient appears stated age and is in NAD. HEENT:  Normocephalic, atraumatic.   Neurological examination:  Orientation: The patient is alert and oriented x3. Cranial nerves: There is good facial symmetry without facial hypomimia. The speech is fluent and clear.  Hearing is intact to conversational tone. Motor: Strength is at least antigravity x4.  Movement examination: Abnormal movements: there is RUE rest tremor only with ambulation (same as last visit) Coordination:  There is decremation only with finger taps on the R.this is same as prior   Follow up Instructions      -I discussed the assessment and treatment plan with the patient. The patient was provided an opportunity to ask questions and all were answered. The patient agreed with the plan and demonstrated an understanding of the instructions.   The patient was advised to call back or seek an in-person evaluation if the symptoms worsen or if the condition fails to improve as anticipated.    Total time spent on today's visit was , including both face-to-face time and nonface-to-face time.  Time included that spent on review of records (prior notes available to me/labs/imaging if pertinent), discussing treatment and goals, answering patient's questions and coordinating  care.   Kerin Salen, DO    Cc:  Thana Ates, MD

## 2023-07-21 ENCOUNTER — Ambulatory Visit: Payer: Medicare PPO | Admitting: Neurology

## 2023-08-22 ENCOUNTER — Ambulatory Visit: Payer: Medicare PPO | Admitting: Neurology

## 2023-08-31 DIAGNOSIS — H40051 Ocular hypertension, right eye: Secondary | ICD-10-CM | POA: Diagnosis not present

## 2023-08-31 DIAGNOSIS — Z961 Presence of intraocular lens: Secondary | ICD-10-CM | POA: Diagnosis not present

## 2023-09-20 ENCOUNTER — Other Ambulatory Visit: Payer: Self-pay | Admitting: Neurology

## 2023-09-20 DIAGNOSIS — G20A1 Parkinson's disease without dyskinesia, without mention of fluctuations: Secondary | ICD-10-CM

## 2023-10-16 DIAGNOSIS — Z1231 Encounter for screening mammogram for malignant neoplasm of breast: Secondary | ICD-10-CM | POA: Diagnosis not present

## 2023-11-07 ENCOUNTER — Other Ambulatory Visit: Payer: Self-pay | Admitting: Neurology

## 2023-11-07 DIAGNOSIS — G20A1 Parkinson's disease without dyskinesia, without mention of fluctuations: Secondary | ICD-10-CM

## 2023-11-22 DIAGNOSIS — Z23 Encounter for immunization: Secondary | ICD-10-CM | POA: Diagnosis not present

## 2023-11-22 DIAGNOSIS — E039 Hypothyroidism, unspecified: Secondary | ICD-10-CM | POA: Diagnosis not present

## 2023-11-22 DIAGNOSIS — Z79899 Other long term (current) drug therapy: Secondary | ICD-10-CM | POA: Diagnosis not present

## 2023-11-22 DIAGNOSIS — F419 Anxiety disorder, unspecified: Secondary | ICD-10-CM | POA: Diagnosis not present

## 2023-11-22 DIAGNOSIS — Z0000 Encounter for general adult medical examination without abnormal findings: Secondary | ICD-10-CM | POA: Diagnosis not present

## 2023-11-22 DIAGNOSIS — E7800 Pure hypercholesterolemia, unspecified: Secondary | ICD-10-CM | POA: Diagnosis not present

## 2023-11-22 DIAGNOSIS — E559 Vitamin D deficiency, unspecified: Secondary | ICD-10-CM | POA: Diagnosis not present

## 2023-11-22 DIAGNOSIS — G20A1 Parkinson's disease without dyskinesia, without mention of fluctuations: Secondary | ICD-10-CM | POA: Diagnosis not present

## 2023-11-22 DIAGNOSIS — F325 Major depressive disorder, single episode, in full remission: Secondary | ICD-10-CM | POA: Diagnosis not present

## 2023-12-25 ENCOUNTER — Other Ambulatory Visit: Payer: Self-pay | Admitting: Neurology

## 2023-12-25 DIAGNOSIS — G20A1 Parkinson's disease without dyskinesia, without mention of fluctuations: Secondary | ICD-10-CM

## 2024-01-23 NOTE — Progress Notes (Unsigned)
Assessment/Plan:   1.  Parkinsons Disease             -Increase carbidopa/levodopa 25/100, 2 at 8am/2 at noon/1 at 4pm             -[facility] genetic testing is negative.             -We discussed that it used to be thought that levodopa would increase risk of melanoma but now it is believed that Parkinsons itself likely increases risk of melanoma. she is to get regular skin checks.  She is doing that   2.  Restless leg/arm syndrome             -she is on gabapentin 600 mg but only takes 1/2 of it.  She doesn't want the 300 mg dosage just in case she needs to go back up.      3.  Depression             -Continue Wellbutrin XL, 300 mg daily  -Add escitalopram, 10 mg daily  -She has a counselor but has not seen her in a long time.  She promised me she would make an appointment with her counselor Emiliano Dyer) before next visit  -One of the stressors that she has is driving and I asked her to learn to use Benedetto Goad before next visit.  I have no objection to her driving, but want her to know how to use Benedetto Goad for when she needs it.  3.  Insomnia  -Add melatonin, 3 mg at night.  We can add other medicines in the future, if needed, but we made a lot of changes today and decided that any changes were not good for her today.  Subjective:   Ashley Henderson was seen today in follow up for Parkinsons disease.  My previous records were reviewed prior to todays visit as well as outside records available to me.  She has had no falls.  No lightheadedness or near syncope.  She is still on her Wellbutrin.  She was very worried last visit about how she would feel when her dog passed away.  She reports today that she feels bad, physically and emotionally.  She is quite depressed.  She describes off ime every day at any time, but never in the early AM.  The R arm feels restless.  She is having trouble sleeping.  She is frustrated with weight and is eating b/c depressed.    Current prescribed movement disorder  medications: Carbidopa/levodopa 25/100, 1 tablet 3 times per day (told her to take it at 8 AM/noon/4 PM -previously waking up in the middle of the night to take it) Gabapentin, 600 mg at bedtime (she is only taking 300 mg of this) Wellbutrin XL, 300 mg daily  ALLERGIES:   Allergies  Allergen Reactions   Belviq [Lorcaserin] Other (See Comments)    drowsiness   Other     antihistamines   Biaxin [Clarithromycin] Rash   Sulfa Antibiotics Rash    CURRENT MEDICATIONS:  Outpatient Encounter Medications as of 01/25/2024  Medication Sig   buPROPion (WELLBUTRIN XL) 300 MG 24 hr tablet Take 1 tablet (300 mg total) by mouth daily.   carbidopa-levodopa (SINEMET IR) 25-100 MG tablet TAKE ONE TABLET THREE TIMES DAILY 8AM/ NOON/4PM   clobetasol ointment (TEMOVATE) 0.05 % APPLY TWICE DAILY--CAN USE FOR UP TO 7 DAYs   dicyclomine (BENTYL) 20 MG tablet Take 20 mg by mouth every 6 (six) hours. Takes as needed  Docusate Calcium (STOOL SOFTENER PO) Take 1 tablet by mouth daily.   FIBER PO Take by mouth 2 (two) times daily.   fluticasone (FLONASE) 50 MCG/ACT nasal spray Place 2 sprays into the nose as needed.   gabapentin (NEURONTIN) 600 MG tablet TAKE ONE TABLET AT BEDTIME   levothyroxine (SYNTHROID, LEVOTHROID) 75 MCG tablet Take 1 tablet by mouth every other day.   levothyroxine (SYNTHROID, LEVOTHROID) 88 MCG tablet Take 88 mcg by mouth every other day.    loratadine (CLARITIN) 10 MG tablet Take 10 mg by mouth daily as needed for allergies.   Magnesium 400 MG CAPS Take 1 capsule by mouth daily. With food   meclizine (ANTIVERT) 12.5 MG tablet Take 12.5 mg by mouth 3 (three) times daily as needed for dizziness.   meloxicam (MOBIC) 15 MG tablet Take 15 mg by mouth daily.   Probiotic Product (ALIGN) 4 MG CAPS Take 1 tablet by mouth daily.   valACYclovir (VALTREX) 500 MG tablet Take 1 tablet (500 mg total) by mouth 2 (two) times daily. Take one tablet twice daily at onset of symptoms for 3 days.   Vitamin  D, Ergocalciferol, (DRISDOL) 1.25 MG (50000 UNIT) CAPS capsule Take 50,000 Units by mouth once a week.   No facility-administered encounter medications on file as of 01/25/2024.    Objective:   PHYSICAL EXAMINATION:    VITALS:   Vitals:   01/25/24 1521  BP: 120/74  Pulse: 77  SpO2: 96%  Weight: 190 lb 9.6 oz (86.5 kg)    Wt Readings from Last 3 Encounters:  01/25/24 190 lb 9.6 oz (86.5 kg)  07/13/23 183 lb (83 kg)  03/01/23 182 lb (82.6 kg)    GEN:  The patient appears stated age and is in NAD. HEENT:  Normocephalic, atraumatic.  The mucous membranes are moist. The superficial temporal arteries are without ropiness or tenderness. CV:  RRR Lungs:  CTAB Neck/HEME:  There are no carotid bruits bilaterally.  Neurological examination:  Orientation: The patient is alert and oriented x3. Cranial nerves: There is good facial symmetry without facial hypomimia. The speech is fluent and clear. Soft palate rises symmetrically and there is no tongue deviation. Hearing is intact to conversational tone. Sensation: Sensation is intact to light touch throughout Motor: Strength is at least antigravity x4.  Movement examination: Tone: There is min increased tone in the RUE and mild to mod in the RLE Abnormal movements: there is RUE rest tremor only with ambulation (same as last visit) Coordination:  There is decremation only with finger taps on the R and alternation of supination/pronation of the forearm on the R.   Gait and Station: The patient has no difficulty arising out of a deep-seated chair without the use of the hands. The patient's stride length is slight decreased with decreased arm swing on the R.    Total time spent on today's visit was 40 minutes, including both face-to-face time and nonface-to-face time.  Time included that spent on review of records (prior notes available to me/labs/imaging if pertinent), discussing treatment and goals, answering patient's questions and  coordinating care.    Cc:  Thana Ates, MD

## 2024-01-25 ENCOUNTER — Ambulatory Visit: Payer: Medicare PPO | Admitting: Neurology

## 2024-01-25 VITALS — BP 120/74 | HR 77 | Wt 190.6 lb

## 2024-01-25 DIAGNOSIS — G20A1 Parkinson's disease without dyskinesia, without mention of fluctuations: Secondary | ICD-10-CM

## 2024-01-25 DIAGNOSIS — G4701 Insomnia due to medical condition: Secondary | ICD-10-CM | POA: Diagnosis not present

## 2024-01-25 DIAGNOSIS — F330 Major depressive disorder, recurrent, mild: Secondary | ICD-10-CM | POA: Diagnosis not present

## 2024-01-25 MED ORDER — CARBIDOPA-LEVODOPA 25-100 MG PO TABS: ORAL_TABLET | ORAL | 1 refills | Status: DC

## 2024-01-25 MED ORDER — ESCITALOPRAM OXALATE 10 MG PO TABS: 10.0000 mg | ORAL_TABLET | Freq: Every day | ORAL | 1 refills | Status: DC

## 2024-01-25 NOTE — Patient Instructions (Addendum)
Continue carbidopa/levodopa 25/100, 2 at 8am, 1 at noon, 1 at 4pm x 1 week and then increase carbidopa/levodopa, 2 at 8am, 2 at noon and 1 at 4pm thereafter Add melatonin, start at 3mg  first.  You can get up to 8 mg.   Add lexapro 10 mg daily Make an appointment with [facility] for counseling Learn to use Benedetto Goad!

## 2024-02-06 ENCOUNTER — Other Ambulatory Visit: Payer: Self-pay | Admitting: Neurology

## 2024-02-06 DIAGNOSIS — G20A1 Parkinson's disease without dyskinesia, without mention of fluctuations: Secondary | ICD-10-CM

## 2024-03-04 DIAGNOSIS — H04123 Dry eye syndrome of bilateral lacrimal glands: Secondary | ICD-10-CM | POA: Diagnosis not present

## 2024-03-04 DIAGNOSIS — H40053 Ocular hypertension, bilateral: Secondary | ICD-10-CM | POA: Diagnosis not present

## 2024-04-02 ENCOUNTER — Other Ambulatory Visit (HOSPITAL_BASED_OUTPATIENT_CLINIC_OR_DEPARTMENT_OTHER): Payer: Self-pay | Admitting: Obstetrics & Gynecology

## 2024-04-02 DIAGNOSIS — L280 Lichen simplex chronicus: Secondary | ICD-10-CM

## 2024-04-02 NOTE — Progress Notes (Unsigned)
 Assessment/Plan:   1.  Parkinsons Disease             -Increase carbidopa /levodopa  25/100, 2 at 8am/2 at noon/1 at 4pm             -[facility] genetic testing is negative.             -We discussed that it used to be thought that levodopa  would increase risk of melanoma but now it is believed that Parkinsons itself likely increases risk of melanoma. she is to get regular skin checks.  She is doing that   2.  Restless leg/arm syndrome             -she is on gabapentin  600 mg but only takes 1/2 of it.  She doesn't want the 300 mg dosage just in case she needs to go back up.      3.  Depression             -Continue Wellbutrin  XL, 300 mg daily  -*** escitalopram , 10 mg daily  -She has a counselor but has not seen her in a long time.  She promised me she would make an appointment with her counselor Ludger [place]) before next visit  -One of the stressors that she has is driving and I asked her to learn to use Baby [employer]olt before next visit.  I have no objection to her driving, but want her to know how to use Baby Bolt for when she needs it.  3.  Insomnia  -Add melatonin, 3 mg at night.  We can add other medicines in the future, if needed, but we made a lot of changes today and decided that any changes were not good for her today.  Subjective:   Ashley Henderson was seen today in follow up for Parkinsons disease.  My previous records were reviewed prior to todays visit as well as outside records available to me.  Patient was last seen in February at which time I told her to increase her levodopa .  She reports today that ***.  We also added escitalopram  last visit, 10 mg daily.  Current prescribed movement disorder medications: Carbidopa /levodopa  25/100, 2/2/1 (increased) Gabapentin , 600 mg at bedtime (she is only taking 300 mg of this) Escitalopram , 10 mg daily Wellbutrin  XL, 300 mg daily  ALLERGIES:   Allergies  Allergen Reactions   Belviq [Lorcaserin] Other (See Comments)    drowsiness   Other      antihistamines   Biaxin [Clarithromycin] Rash   Sulfa Antibiotics Rash    CURRENT MEDICATIONS:  Outpatient Encounter Medications as of 04/04/2024  Medication Sig   buPROPion  (WELLBUTRIN  XL) 300 MG 24 hr tablet Take 1 tablet (300 mg total) by mouth daily.   carbidopa -levodopa  (SINEMET  IR) 25-100 MG tablet 2 at 8am, 2 at noon, 1 at 4pm   clobetasol  ointment (TEMOVATE ) 0.05 % APPLY TWICE DAILY--CAN USE FOR UP TO 7 DAYs   dicyclomine  (BENTYL ) 20 MG tablet Take 20 mg by mouth every 6 (six) hours. Takes as needed   Docusate Calcium (STOOL SOFTENER PO) Take 1 tablet by mouth daily.   escitalopram  (LEXAPRO ) 10 MG tablet Take 1 tablet (10 mg total) by mouth daily.   FIBER PO Take by mouth 2 (two) times daily.   fluticasone (FLONASE) 50 MCG/ACT nasal spray Place 2 sprays into the nose as needed.   gabapentin  (NEURONTIN ) 600 MG tablet TAKE ONE TABLET AT BEDTIME   levothyroxine (SYNTHROID, LEVOTHROID) 75 MCG tablet Take 1 tablet by mouth  every other day.   levothyroxine (SYNTHROID, LEVOTHROID) 88 MCG tablet Take 88 mcg by mouth every other day.    loratadine (CLARITIN) 10 MG tablet Take 10 mg by mouth daily as needed for allergies.   Magnesium 400 MG CAPS Take 1 capsule by mouth daily. With food   meclizine (ANTIVERT) 12.5 MG tablet Take 12.5 mg by mouth 3 (three) times daily as needed for dizziness.   meloxicam (MOBIC) 15 MG tablet Take 15 mg by mouth daily.   Probiotic Product (ALIGN) 4 MG CAPS Take 1 tablet by mouth daily.   valACYclovir  (VALTREX ) 500 MG tablet Take 1 tablet (500 mg total) by mouth 2 (two) times daily. Take one tablet twice daily at onset of symptoms for 3 days.   Vitamin D, Ergocalciferol, (DRISDOL) 1.25 MG (50000 UNIT) CAPS capsule Take 50,000 Units by mouth once a week.   No facility-administered encounter medications on file as of 04/04/2024.    Objective:   PHYSICAL EXAMINATION:    VITALS:   There were no vitals filed for this visit.   Wt Readings from Last 3  Encounters:  01/25/24 190 lb 9.6 oz (86.5 kg)  07/13/23 183 lb (83 kg)  03/01/23 182 lb (82.6 kg)    GEN:  The patient appears stated age and is in NAD. HEENT:  Normocephalic, atraumatic.  The mucous membranes are moist. The superficial temporal arteries are without ropiness or tenderness. CV:  RRR Lungs:  CTAB Neck/HEME:  There are no carotid bruits bilaterally.  Neurological examination:  Orientation: The patient is alert and oriented x3. Cranial nerves: There is good facial symmetry without facial hypomimia. The speech is fluent and clear. Soft palate rises symmetrically and there is no tongue deviation. Hearing is intact to conversational tone. Sensation: Sensation is intact to light touch throughout Motor: Strength is at least antigravity x4.  Movement examination: Tone: There is min increased tone in the RUE and mild to mod in the RLE Abnormal movements: there is RUE rest tremor only with ambulation (same as last visit) Coordination:  There is decremation only with finger taps on the R and alternation of supination/pronation of the forearm on the R.   Gait and Station: The patient has no difficulty arising out of a deep-seated chair without the use of the hands. The patient's stride length is slight decreased with decreased arm swing on the R.    Total time spent on today's visit was 40 minutes, including both face-to-face time and nonface-to-face time.  Time included that spent on review of records (prior notes available to me/labs/imaging if pertinent), discussing treatment and goals, answering patient's questions and coordinating care.    Cc:  Tena Feeling, MD

## 2024-04-04 ENCOUNTER — Ambulatory Visit: Payer: Medicare PPO | Admitting: Neurology

## 2024-04-04 ENCOUNTER — Encounter: Payer: Self-pay | Admitting: Neurology

## 2024-04-04 VITALS — BP 136/88 | HR 67 | Ht 63.0 in | Wt 189.2 lb

## 2024-04-04 DIAGNOSIS — G20A1 Parkinson's disease without dyskinesia, without mention of fluctuations: Secondary | ICD-10-CM | POA: Diagnosis not present

## 2024-04-04 DIAGNOSIS — G2581 Restless legs syndrome: Secondary | ICD-10-CM | POA: Diagnosis not present

## 2024-04-04 DIAGNOSIS — F330 Major depressive disorder, recurrent, mild: Secondary | ICD-10-CM

## 2024-04-04 MED ORDER — CARBIDOPA-LEVODOPA ER 50-200 MG PO TBCR: 1.0000 | EXTENDED_RELEASE_TABLET | Freq: Every day | ORAL | 1 refills | Status: DC

## 2024-04-04 NOTE — Patient Instructions (Signed)
 ADD carbidopa /levodopa  50/200 at bedtime or 30-45 min before bedtime  We are doing a Parkinsons Disease symposium on 9/26 at the [facility].  Save the Date!

## 2024-04-11 ENCOUNTER — Other Ambulatory Visit (HOSPITAL_BASED_OUTPATIENT_CLINIC_OR_DEPARTMENT_OTHER): Payer: Self-pay | Admitting: Obstetrics & Gynecology

## 2024-04-12 ENCOUNTER — Telehealth (HOSPITAL_BASED_OUTPATIENT_CLINIC_OR_DEPARTMENT_OTHER): Payer: Self-pay

## 2024-04-12 ENCOUNTER — Other Ambulatory Visit (HOSPITAL_BASED_OUTPATIENT_CLINIC_OR_DEPARTMENT_OTHER): Payer: Self-pay | Admitting: Obstetrics & Gynecology

## 2024-04-12 DIAGNOSIS — F418 Other specified anxiety disorders: Secondary | ICD-10-CM

## 2024-04-12 MED ORDER — ALPRAZOLAM 0.25 MG PO TABS: ORAL_TABLET | ORAL | 0 refills | Status: DC

## 2024-04-12 NOTE — Telephone Encounter (Signed)
 LMOM at 10:45 with Gentleman for patient to call office. Annabell Key would like to know if she is needing something for anxiety short term. tbw

## 2024-04-12 NOTE — Telephone Encounter (Signed)
 Patient called on 04/11/2024. She is wanting to know if you will call her something in for anxiety. She is getting ready to go to the beach and her property requires her to get into the elevator. She states it sounds minor but it is very real for her. She states that this has been discussed at previous visits. Please advise. tbw

## 2024-04-12 NOTE — Telephone Encounter (Signed)
 Patient called back. She will only be going short term. She did see her therapist yesterday but she does not write rx. tbw

## 2024-05-13 ENCOUNTER — Telehealth: Payer: Self-pay | Admitting: Neurology

## 2024-05-13 NOTE — Telephone Encounter (Signed)
 Patient is wanting to let Dr Tat know that she is still having weakness and would like to speak to someone.   She is on Carbidopa  levodopa  and Gabapentin 

## 2024-05-15 ENCOUNTER — Other Ambulatory Visit: Payer: Self-pay | Admitting: Neurology

## 2024-05-15 DIAGNOSIS — G20A1 Parkinson's disease without dyskinesia, without mention of fluctuations: Secondary | ICD-10-CM

## 2024-05-15 NOTE — Telephone Encounter (Signed)
 Called patient and she wanted to let me know that she is weak and shaky not all day at lunch and in the afternoon. She has been taking her meds 2@8  2@ noon and 1 at 4 . Some days it doesn't happen at all. She feels Irritable and limp weak and shaky

## 2024-05-16 ENCOUNTER — Other Ambulatory Visit: Payer: Self-pay | Admitting: Orthopedic Surgery

## 2024-05-16 DIAGNOSIS — M542 Cervicalgia: Secondary | ICD-10-CM | POA: Diagnosis not present

## 2024-05-16 DIAGNOSIS — M5412 Radiculopathy, cervical region: Secondary | ICD-10-CM

## 2024-05-20 ENCOUNTER — Ambulatory Visit: Admitting: Neurology

## 2024-05-20 ENCOUNTER — Encounter: Payer: Self-pay | Admitting: Neurology

## 2024-05-20 VITALS — BP 116/78 | HR 64 | Wt 189.2 lb

## 2024-05-20 DIAGNOSIS — G20A1 Parkinson's disease without dyskinesia, without mention of fluctuations: Secondary | ICD-10-CM | POA: Diagnosis not present

## 2024-05-20 NOTE — Patient Instructions (Signed)
 Invest in a blood pressure cuff. Take your blood pressure at various times and lying sitting and standing. Please record those numbers for us . When you have a spell chew a levodopa  and let us  know if that helps.

## 2024-05-20 NOTE — Progress Notes (Signed)
 Assessment/Plan:   1.  Parkinsons Disease             - Continue carbidopa /levodopa  25/100, 2 at 8am/2 at noon/1 at 4pm.   - Continue carbidopa /levodopa  50/200 CR at bedtime  -when she has a "spell" she will chew a levodopa  and see if it helps the spell  -she will buy a blood pressure cuff and check BP and send log to me             -[employer] genetic testing is negative.             -We discussed that it used to be thought that levodopa  would increase risk of melanoma but now it is believed that Parkinsons itself likely increases risk of melanoma. she is to get regular skin checks.  She is doing that   2.  Restless leg/arm syndrome             -she is on gabapentin  600 mg at bedtime.    -resolved with adding CR levododpa and moving day dosages closer together   3.  Depression             -Continue Wellbutrin  XL, 300 mg daily  - Improved with escitalopram , 10 mg daily  -She is following with her counselor now, which has really helped.   4.  Insomnia  -on melatonin, 6 mg and that has helped.    5.  Cervical radiculopathy  -following with Dr. Marland Silvas  Subjective:   Ashley Henderson was seen today in follow up for Parkinsons disease.  My previous records were reviewed prior to todays visit as well as outside records available to me.  Patient worked in today as she was feeling intermittently weak and shaky.  She and I did discuss moving her medication closer together because she was spreading it out fairly far last visit.  She has done that and is taking 2 at 8 AM, 2 at noon and 1 at 4 PM.  She has added the CR at bed.  The restless arm is better.  She is, however, having intermittent "weak spells" that are described as being "limp, irritable, but I also want to lie down."  It might last 3 hours.  It is usually in the afternoon.  Its definitely a feeling of anxiety. When she awakens in the AM, she has a little near syncope.  Having radicular pain on the L arm and seeing Dr.  Marland Silvas  Current prescribed movement disorder medications: Carbidopa /levodopa  25/100, 2/2/1  Carbidopa /levodopa  50/200 CR at bedtime (added last visit) Gabapentin , 600 mg at bedtime Escitalopram , 10 mg daily Wellbutrin  XL, 300 mg daily  ALLERGIES:   Allergies  Allergen Reactions   Belviq [Lorcaserin] Other (See Comments)    drowsiness   Other     antihistamines   Biaxin [Clarithromycin] Rash   Sulfa Antibiotics Rash    CURRENT MEDICATIONS:  Outpatient Encounter Medications as of 05/20/2024  Medication Sig   ALPRAZolam  (XANAX ) 0.25 MG tablet Take 1 tablet twice daily as needed for situational anxiety.  If dosage makes you sleepy, cut in 1/2.   buPROPion  (WELLBUTRIN  XL) 300 MG 24 hr tablet Take 1 tablet (300 mg total) by mouth daily.   carbidopa -levodopa  (SINEMET  CR) 50-200 MG tablet Take 1 tablet by mouth at bedtime.   carbidopa -levodopa  (SINEMET  IR) 25-100 MG tablet 2 at 8am, 2 at noon, 1 at 4pm   clobetasol  ointment (TEMOVATE ) 0.05 % APPLY TWICE DAILY -CAN USE UPTO SEVEN DAYS  dicyclomine  (BENTYL ) 20 MG tablet Take 20 mg by mouth every 6 (six) hours. Takes as needed   Docusate Calcium (STOOL SOFTENER PO) Take 1 tablet by mouth daily.   escitalopram  (LEXAPRO ) 10 MG tablet Take 1 tablet (10 mg total) by mouth daily.   FIBER PO Take by mouth 2 (two) times daily.   fluticasone (FLONASE) 50 MCG/ACT nasal spray Place 2 sprays into the nose as needed.   gabapentin  (NEURONTIN ) 600 MG tablet TAKE ONE TABLET AT BEDTIME   levothyroxine (SYNTHROID, LEVOTHROID) 75 MCG tablet Take 1 tablet by mouth every other day.   levothyroxine (SYNTHROID, LEVOTHROID) 88 MCG tablet Take 88 mcg by mouth every other day.    loratadine (CLARITIN) 10 MG tablet Take 10 mg by mouth daily as needed for allergies.   Magnesium 400 MG CAPS Take 1 capsule by mouth daily. With food   meclizine (ANTIVERT) 12.5 MG tablet Take 12.5 mg by mouth 3 (three) times daily as needed for dizziness.   meloxicam (MOBIC) 15 MG  tablet Take 15 mg by mouth daily.   Probiotic Product (ALIGN) 4 MG CAPS Take 1 tablet by mouth daily.   valACYclovir  (VALTREX ) 500 MG tablet Take 1 tablet (500 mg total) by mouth 2 (two) times daily. Take one tablet twice daily at onset of symptoms for 3 days.   Vitamin D, Ergocalciferol, (DRISDOL) 1.25 MG (50000 UNIT) CAPS capsule Take 50,000 Units by mouth once a week.   No facility-administered encounter medications on file as of 05/20/2024.    Objective:   PHYSICAL EXAMINATION:    VITALS:   Vitals:   05/20/24 0835  BP: 116/78  Pulse: 64  SpO2: 96%  Weight: 189 lb 3.2 oz (85.8 kg)     Wt Readings from Last 3 Encounters:  05/20/24 189 lb 3.2 oz (85.8 kg)  04/04/24 189 lb 3.2 oz (85.8 kg)  01/25/24 190 lb 9.6 oz (86.5 kg)    GEN:  The patient appears stated age and is in NAD. HEENT:  Normocephalic, atraumatic.  The mucous membranes are moist. The superficial temporal arteries are without ropiness or tenderness. CV:  RRR Lungs:  CTAB Neck/HEME:  There are no carotid bruits bilaterally.  Neurological examination:  Orientation: The patient is alert and oriented x3. Cranial nerves: There is good facial symmetry without facial hypomimia. The speech is fluent and clear. Soft palate rises symmetrically and there is no tongue deviation. Hearing is intact to conversational tone. Sensation: Sensation is intact to light touch throughout Motor: Strength is at least antigravity x4.  Movement examination: Tone: There is nl tone in the UE/LE Abnormal movements: there is no rest tremor Coordination:  There is decremation only with finger taps on the R.  This is stable.  All other RAMs are normal. Gait and Station: The patient has no difficulty arising out of a deep-seated chair without the use of the hands. The patient's stride length is slight decreased with decreased arm swing on the R.    Total time spent on today's visit was 30 minutes, including both face-to-face time and  nonface-to-face time.  Time included that spent on review of records (prior notes available to me/labs/imaging if pertinent), discussing treatment and goals, answering patient's questions and coordinating care.    Cc:  Tena Feeling, MD

## 2024-05-25 ENCOUNTER — Ambulatory Visit
Admission: RE | Admit: 2024-05-25 | Discharge: 2024-05-25 | Disposition: A | Discharge status: Home / Self Care | Source: Ambulatory Visit | Attending: Orthopedic Surgery | Admitting: Orthopedic Surgery

## 2024-05-25 DIAGNOSIS — M5412 Radiculopathy, cervical region: Secondary | ICD-10-CM

## 2024-05-25 DIAGNOSIS — M4802 Spinal stenosis, cervical region: Secondary | ICD-10-CM | POA: Diagnosis not present

## 2024-05-25 DIAGNOSIS — M4722 Other spondylosis with radiculopathy, cervical region: Secondary | ICD-10-CM | POA: Diagnosis not present

## 2024-06-04 DIAGNOSIS — D225 Melanocytic nevi of trunk: Secondary | ICD-10-CM | POA: Diagnosis not present

## 2024-06-04 DIAGNOSIS — L820 Inflamed seborrheic keratosis: Secondary | ICD-10-CM | POA: Diagnosis not present

## 2024-06-04 DIAGNOSIS — Z85828 Personal history of other malignant neoplasm of skin: Secondary | ICD-10-CM | POA: Diagnosis not present

## 2024-06-04 DIAGNOSIS — L570 Actinic keratosis: Secondary | ICD-10-CM | POA: Diagnosis not present

## 2024-06-04 DIAGNOSIS — L738 Other specified follicular disorders: Secondary | ICD-10-CM | POA: Diagnosis not present

## 2024-06-04 DIAGNOSIS — L821 Other seborrheic keratosis: Secondary | ICD-10-CM | POA: Diagnosis not present

## 2024-06-05 ENCOUNTER — Telehealth: Payer: Self-pay | Admitting: Neurology

## 2024-06-05 NOTE — Telephone Encounter (Signed)
 Pt called in stating that when she chews her pills it only takes the edge off barely. It doesn't last long. She is still having the weak and shaky episodes each day.

## 2024-06-10 NOTE — Telephone Encounter (Signed)
 Called pateint and she did get blood pressure cuff and took B/p for a few weeks always normal between 142/79 / 125/81. Patient said if she feels an episode coming on she will chew one pill and it will barely take the edge off. Patient wanting to know if she could go up to chew 2 pills

## 2024-06-11 DIAGNOSIS — M5412 Radiculopathy, cervical region: Secondary | ICD-10-CM | POA: Diagnosis not present

## 2024-06-13 NOTE — Telephone Encounter (Signed)
 Called Pt and she is going to line up an appointment with her PCP for some blood work to see if that answers any questions

## 2024-06-19 DIAGNOSIS — F331 Major depressive disorder, recurrent, moderate: Secondary | ICD-10-CM | POA: Diagnosis not present

## 2024-06-19 DIAGNOSIS — R0609 Other forms of dyspnea: Secondary | ICD-10-CM | POA: Diagnosis not present

## 2024-06-19 DIAGNOSIS — R350 Frequency of micturition: Secondary | ICD-10-CM | POA: Diagnosis not present

## 2024-06-19 DIAGNOSIS — R531 Weakness: Secondary | ICD-10-CM | POA: Diagnosis not present

## 2024-06-19 DIAGNOSIS — E039 Hypothyroidism, unspecified: Secondary | ICD-10-CM | POA: Diagnosis not present

## 2024-06-28 ENCOUNTER — Encounter: Payer: Self-pay | Admitting: Advanced Practice Midwife

## 2024-07-05 DIAGNOSIS — M5412 Radiculopathy, cervical region: Secondary | ICD-10-CM | POA: Diagnosis not present

## 2024-07-08 ENCOUNTER — Telehealth: Payer: Self-pay | Admitting: Neurology

## 2024-07-08 NOTE — Telephone Encounter (Signed)
 Called aptietn she is taking her carbidopa  levodopa  25/100 IR 2 at 8am/2 at noon/1 at 4pm.  Carbidopa  levodopa  50/200 at Bed and she is having to chew 3 pills a day she said times vary but usually it is at AM (mid) , Afternoon ( mid) and night around 7  She is going to bring blood work but she had a UTI and B-12 was low and Dr. Hussain doubled her Prozac

## 2024-07-08 NOTE — Telephone Encounter (Signed)
 Pt called in stating she thinks the carbidopa -levodopa  is helping because she is taking it on an empty stomach and chewing extra tablets. She usually takes more than the 5 she is prescribed. She is wondering how many extra can she chew? She doesn't want to chew too many.  She also had her blood work done at [facility]. She is wondering if Dr. Evonnie got them and if she should bring her copy in?

## 2024-07-17 NOTE — Progress Notes (Unsigned)
 Assessment/Plan:   1.  Parkinsons Disease             - increase carbidopa /levodopa  25/100, 2 at 8am/2 at 11/ 2 at 2pm/ 2 at 4pm.   - Continue carbidopa /levodopa  50/200 CR at bedtime (8pm)  -samples inbrija  given and shown how to use it.  -watch BP             -[facility] genetic testing is negative.             -We discussed that it used to be thought that levodopa  would increase risk of melanoma but now it is believed that Parkinsons itself likely increases risk of melanoma. she is to get regular skin checks.  She is doing that   2.  Restless leg/arm syndrome             -she is on gabapentin  600 mg at bedtime.    -improved with adding CR levododpa and moving day dosages closer together, but started again more recently.   3.  Depression             -off of wellbutrin   -on lexapro  20 mg daily.  Primary care managing.  -She needs to get back to her counselor   4.  Insomnia  -on melatonin, 6 mg and that has helped.    5.  Cervical radiculopathy  -following with Beverley Millman and having injections  6.  Dysphagia  - I wanted to order MBE, but she declined for now  7.  SOB  Was referred to cardiology, but admits she was not planning on going to the appointment.  I told her she really needed to make that appointment.  Subjective:   Ashley Henderson was seen today in follow up for Parkinsons disease.  My previous records were reviewed prior to todays visit as well as outside records available to me.  Last visit, I told the patient to chew her levodopa  to see if it would help the spells that she was having that she was describing as being limp, irritable, and needing to lay down.  This was about 8 weeks ago.  Since that time, she did call my office and stated that she ended up taking 3 extra levodopa  per day, usually midmorning, midafternoon and late evening and was feeling better with that.  It was such an increased dose that we decided to bring her back.  She is overall doing better  but her restless arms are back.  She is also having some swallow trouble with liquids - I get strangled.   She does state that she had a urinary tract infection since last time.  In addition, her primary care physician noted that she was B12 deficient and she has now on a supplement.  Her lexapro  has also been increased.  She has started back to RSB.  She has had some SOB and a referral to cardiology but wasn't sure she wants to go.    Current prescribed movement disorder medications: Carbidopa /levodopa  25/100, 2/2/1 but she chews 3 extra in the day Carbidopa /levodopa  50/200 CR at bedtime (added last visit) Gabapentin , 600 mg at bedtime Escitalopram , 10 mg daily Wellbutrin  XL, 300 mg daily  ALLERGIES:   Allergies  Allergen Reactions   Belviq [Lorcaserin] Other (See Comments)    drowsiness   Other     antihistamines   Biaxin [Clarithromycin] Rash   Sulfa Antibiotics Rash    CURRENT MEDICATIONS:  Outpatient Encounter Medications as of 07/19/2024  Medication Sig  ALPRAZolam  (XANAX ) 0.25 MG tablet Take 1 tablet twice daily as needed for situational anxiety.  If dosage makes you sleepy, cut in 1/2.   buPROPion  (WELLBUTRIN  XL) 300 MG 24 hr tablet Take 1 tablet (300 mg total) by mouth daily.   carbidopa -levodopa  (SINEMET  CR) 50-200 MG tablet Take 1 tablet by mouth at bedtime.   carbidopa -levodopa  (SINEMET  IR) 25-100 MG tablet 2 at 8am, 2 at noon, 1 at 4pm   clobetasol  ointment (TEMOVATE ) 0.05 % APPLY TWICE DAILY -CAN USE UPTO SEVEN DAYS   dicyclomine  (BENTYL ) 20 MG tablet Take 20 mg by mouth every 6 (six) hours. Takes as needed   Docusate Calcium (STOOL SOFTENER PO) Take 1 tablet by mouth daily.   escitalopram  (LEXAPRO ) 10 MG tablet Take 1 tablet (10 mg total) by mouth daily.   FIBER PO Take by mouth 2 (two) times daily.   fluticasone (FLONASE) 50 MCG/ACT nasal spray Place 2 sprays into the nose as needed.   gabapentin  (NEURONTIN ) 600 MG tablet TAKE ONE TABLET AT BEDTIME   levothyroxine  (SYNTHROID, LEVOTHROID) 75 MCG tablet Take 1 tablet by mouth every other day.   levothyroxine (SYNTHROID, LEVOTHROID) 88 MCG tablet Take 88 mcg by mouth every other day.    loratadine (CLARITIN) 10 MG tablet Take 10 mg by mouth daily as needed for allergies.   Magnesium 400 MG CAPS Take 1 capsule by mouth daily. With food   meclizine (ANTIVERT) 12.5 MG tablet Take 12.5 mg by mouth 3 (three) times daily as needed for dizziness.   meloxicam (MOBIC) 15 MG tablet Take 15 mg by mouth daily.   Probiotic Product (ALIGN) 4 MG CAPS Take 1 tablet by mouth daily.   valACYclovir  (VALTREX ) 500 MG tablet Take 1 tablet (500 mg total) by mouth 2 (two) times daily. Take one tablet twice daily at onset of symptoms for 3 days.   Vitamin D, Ergocalciferol, (DRISDOL) 1.25 MG (50000 UNIT) CAPS capsule Take 50,000 Units by mouth once a week.   No facility-administered encounter medications on file as of 07/19/2024.    Objective:   PHYSICAL EXAMINATION:    VITALS:   Vitals:   07/19/24 1330  BP: 100/60  Pulse: 67  SpO2: 98%  Weight: 188 lb 6.4 oz (85.5 kg)      Wt Readings from Last 3 Encounters:  07/19/24 188 lb 6.4 oz (85.5 kg)  05/20/24 189 lb 3.2 oz (85.8 kg)  04/04/24 189 lb 3.2 oz (85.8 kg)    GEN:  The patient appears stated age and is in NAD. HEENT:  Normocephalic, atraumatic.  The mucous membranes are moist. The superficial temporal arteries are without ropiness or tenderness. CV:  RRR Lungs:  CTAB Neck/HEME:  There are no carotid bruits bilaterally.  Neurological examination:  Orientation: The patient is alert and oriented x3. Cranial nerves: There is good facial symmetry without facial hypomimia. The speech is fluent and clear. Soft palate rises symmetrically and there is no tongue deviation. Hearing is intact to conversational tone. Sensation: Sensation is intact to light touch throughout Motor: Strength is at least antigravity x4.  Movement examination: Tone: There is nl tone in the  UE/LE Abnormal movements: there is no rest tremor Coordination:  There is decremation only with finger taps on the R.  This is stable from prior.  All other RAMs are normal.   Total time spent on today's visit was 40 minutes, including both face-to-face time and nonface-to-face time.  Time included that spent on review of records (prior notes  available to me/labs/imaging if pertinent), discussing treatment and goals, answering patient's questions and coordinating care.    Cc:  Dwight Trula SQUIBB, MD

## 2024-07-19 ENCOUNTER — Encounter: Payer: Self-pay | Admitting: Neurology

## 2024-07-19 ENCOUNTER — Ambulatory Visit: Admitting: Neurology

## 2024-07-19 VITALS — BP 100/60 | HR 67 | Wt 188.4 lb

## 2024-07-19 DIAGNOSIS — R0602 Shortness of breath: Secondary | ICD-10-CM | POA: Diagnosis not present

## 2024-07-19 DIAGNOSIS — G20A1 Parkinson's disease without dyskinesia, without mention of fluctuations: Secondary | ICD-10-CM | POA: Diagnosis not present

## 2024-07-19 DIAGNOSIS — F330 Major depressive disorder, recurrent, mild: Secondary | ICD-10-CM | POA: Diagnosis not present

## 2024-07-19 MED ORDER — CARBIDOPA-LEVODOPA 25-100 MG PO TABS: ORAL_TABLET | ORAL | 1 refills | Status: DC

## 2024-07-19 MED ORDER — INBRIJA 42 MG IN CAPS: ORAL_CAPSULE | RESPIRATORY_TRACT | 0 refills | Status: DC

## 2024-07-19 NOTE — Patient Instructions (Addendum)
 Make an appointment with your cardiologist and your counselor  Increase carbidopa /levodopa  25/100, 2 at 8am/2 at 11/ 2 at 2pm/ 2 at 4pm Continue carbidopa /levodopa  50/200 at bed  We are going to start inbrija .  Remember that TWO capsules is ONE dosage (never inhale just one capsule).  You can inhale the capsules as needed up to 5 times per day, separated by 2 hour intervals.  Many patients use this right when the wake up to help with first morning on and then as needed during the day.  You should take a sip of water prior to using the inhaler to avoid side effects.  It may generate some cough right when you use it and that is normal.  There are nurse educators available to help you with this device.  You can call [phone] and they will set you up with a nurse educator to assist you for free of charge.  They are available 8am-8pm Monday-Friday.  SAVE THE DATE!  We are planning a Parkinsons Disease educational symposium at [facility], [address] [place] [address], [place], [place] [address] on September 19.  We will have a movement disorder physician expert from [employer] coming to speak and a caregiver speaker.  We will have a panel of experts that will show you who you may need on your team of people on your journey with Parkinsons.  I hope to see you there!  Use this QR code to register by scanning it with the camera app on your phone:      Need more help with registration?  Contact [email or web address]

## 2024-07-23 DIAGNOSIS — M5412 Radiculopathy, cervical region: Secondary | ICD-10-CM | POA: Diagnosis not present

## 2024-07-24 ENCOUNTER — Telehealth: Payer: Self-pay | Admitting: Neurology

## 2024-07-24 NOTE — Telephone Encounter (Signed)
 Caller stated that they need to leave a message for Ashley Henderson. Pt was seen as a f/u at [facility] post spine injection, was dx with a pinched nerve, and medication change was ordered. Caller wants to discuss medication change with nurse.

## 2024-07-25 ENCOUNTER — Other Ambulatory Visit: Payer: Self-pay

## 2024-07-25 ENCOUNTER — Telehealth: Payer: Self-pay

## 2024-07-25 DIAGNOSIS — G20A1 Parkinson's disease without dyskinesia, without mention of fluctuations: Secondary | ICD-10-CM

## 2024-07-25 MED ORDER — INBRIJA 42 MG IN CAPS: ORAL_CAPSULE | RESPIRATORY_TRACT | 3 refills | Status: DC

## 2024-07-25 NOTE — Telephone Encounter (Signed)
 Patient called and spoke with me about her pinched nerve and injection. She is finding help with the inbrijia and I have sent in the Rx and started the Appoval

## 2024-07-25 NOTE — Telephone Encounter (Signed)
 Good morning this patient needs a PA for Inbrija  42 mg patient can use medication up to five times per day with 2 hour intervals. Patient diagnosis code is G20. A1

## 2024-07-29 ENCOUNTER — Telehealth: Payer: Self-pay | Admitting: Neurology

## 2024-07-29 ENCOUNTER — Other Ambulatory Visit (HOSPITAL_COMMUNITY): Payer: Self-pay

## 2024-07-29 NOTE — Telephone Encounter (Signed)
 Called pharmacy and gave information

## 2024-07-29 NOTE — Telephone Encounter (Signed)
 Insurance pays maximum of 30 day supply. Package cannot be broken, so can do 120 capsules for 24 days (this is 5 a day dosing) for co-pay of $100.00

## 2024-07-29 NOTE — Telephone Encounter (Signed)
 Pharmacy calling for more details on Rx instructions

## 2024-07-29 NOTE — Telephone Encounter (Signed)
 Left message on voicemail to call back with what was needed.

## 2024-08-07 NOTE — Telephone Encounter (Signed)
 Pt wanted to let Ashley Henderson know she got the approval for her Inbrijia

## 2024-08-20 ENCOUNTER — Other Ambulatory Visit: Payer: Self-pay | Admitting: Neurology

## 2024-08-20 DIAGNOSIS — E039 Hypothyroidism, unspecified: Secondary | ICD-10-CM | POA: Diagnosis not present

## 2024-08-20 DIAGNOSIS — G20A1 Parkinson's disease without dyskinesia, without mention of fluctuations: Secondary | ICD-10-CM

## 2024-08-20 DIAGNOSIS — E538 Deficiency of other specified B group vitamins: Secondary | ICD-10-CM | POA: Diagnosis not present

## 2024-08-21 ENCOUNTER — Telehealth: Payer: Self-pay | Admitting: Neurology

## 2024-08-21 NOTE — Telephone Encounter (Signed)
 Pt needs refill sent before tomorrow, going out of town Rx:gabapentin  (NEURONTIN ) 600 MG tablet

## 2024-08-21 NOTE — Telephone Encounter (Signed)
 Medication has been sent in

## 2024-08-30 ENCOUNTER — Telehealth: Payer: Self-pay | Admitting: Neurology

## 2024-08-30 NOTE — Telephone Encounter (Signed)
 Pt called, last time she was here she was given  sample inhaler. It was never called in, she needs an RX sent in. The medication is Inbrijia 42mg .

## 2024-09-02 ENCOUNTER — Other Ambulatory Visit: Payer: Self-pay

## 2024-09-02 DIAGNOSIS — G20A1 Parkinson's disease without dyskinesia, without mention of fluctuations: Secondary | ICD-10-CM

## 2024-09-02 MED ORDER — INBRIJA 42 MG IN CAPS: ORAL_CAPSULE | RESPIRATORY_TRACT | 3 refills | Status: AC

## 2024-09-03 NOTE — Telephone Encounter (Signed)
 Called patient and called inbrijia patient did not recognize the phone number and did not return the voicemail's left. Inbrijia calling again to get patient set up

## 2024-09-05 DIAGNOSIS — H40053 Ocular hypertension, bilateral: Secondary | ICD-10-CM | POA: Diagnosis not present

## 2024-09-05 DIAGNOSIS — Z961 Presence of intraocular lens: Secondary | ICD-10-CM | POA: Diagnosis not present

## 2024-09-05 DIAGNOSIS — H04123 Dry eye syndrome of bilateral lacrimal glands: Secondary | ICD-10-CM | POA: Diagnosis not present

## 2024-09-06 NOTE — Progress Notes (Unsigned)
 Cardiology Clinic Note   Patient Name: Ashley Henderson Date of Encounter: 09/10/2024  Primary Care Provider:  Dwight Trula SQUIBB, MD Primary Cardiologist:  None  Patient Profile    Ashley Henderson 83 year old female presents to the clinic today for evaluation of her dyspnea and to establish care.  Past Medical History    Past Medical History:  Diagnosis Date   Abnormal uterine bleeding (AUB)    endo biopsy negative   ADD (attention deficit disorder)    Allergic rhinitis    Anxiety    Bouchard nodes (DJD hand)    Claustrophobia    Colon polyp    Cystic acne    Depression    Diverticulosis    Glaucoma    Goiter    Hematuria 1994   negative cysto, IVP   Hemorrhoids    Hyperlipidemia    Hyperplastic colon polyp    Hypothyroidism    IBS (irritable bowel syndrome)    using align-saw Dr Obie in 2014   Obesity    Parkinson disease (HCC)    Plantar fasciitis    Restless leg syndrome    Vertigo    Past Surgical History:  Procedure Laterality Date   breast implants replaced  6/08   GLAUCOMA SURGERY     bilateral laser   SHOULDER SURGERY  3/03, 9/01   bilateral   SIMPLE MASTECTOMY  1984   bilateral with implants (prophylactic mastectomy)    Allergies  Allergies  Allergen Reactions   Belviq [Lorcaserin] Other (See Comments)    drowsiness   Other     antihistamines   Biaxin [Clarithromycin] Rash   Sulfa Antibiotics Rash    History of Present Illness    Ashley Henderson has a PMH of IBS, rectal bleeding, hypothyroidism, Parkinson's, depression, insomnia, and family history of breast cancer.  Underwent stress testing 2008 which showed no ischemia.  Coronary calcium scoring 01/06/2023 with left main 0, LAD 39, circumflex 16.4, RCA 12.3 and total Agatston score of 67.7.  She was seen and evaluated on 06/19/2024 by her PCP.  She reported dyspnea on exertion and fatigue.  She reported that she previously was exercising on her treadmill.  However lately she was not able  to due to fatigue and dyspnea.  She was instructed to follow-up with cardiology.  During appointment visit she also noted weakness and shaky episodes.  The episodes have been present for 1 year.  She noted that some days were better and some days were worse.  She denied palpitations.  Her Parkinson's was managed by Dr. Evonnie.  She was continued on Sinemet .  She denied chest pain.  She denied GI issues.  She did note occasional dizzy episodes.  She did note that she was going to boxing classes for her Parkinson's.  She noted that she was not able to do much during these episodes/specimens.  She reported taking Lexapro  as prescribed.  She presents to the clinic today for cardiology evaluation.  She states she has been exercising with a trainer 2 times per week.  She notes increased fatigue for several months.  She has been taking increased carbidopa  levodopa  due to shakiness from her Parkinson's.  This has made a difference.  She denies weight gain.  She notes that she has been dizzy for quite some time.  We reviewed her referral notes.  She expressed understanding.  We also reviewed her previous coronary calcium scoring.  I reviewed options for further testing and we used shared  decision making to decide to proceed with echocardiogram, CBC and BMP.  Today she denies chest pain, shortness of breath, lower extremity edema,  palpitations, melena, hematuria, hemoptysis, diaphoresis, weakness, presyncope, syncope, orthopnea, and PND.      Home Medications    Prior to Admission medications   Medication Sig Start Date End Date Taking? Authorizing Provider  carbidopa -levodopa  (SINEMET  CR) 50-200 MG tablet Take 1 tablet by mouth at bedtime. 04/04/24   Tat, Asberry RAMAN, DO  carbidopa -levodopa  (SINEMET  IR) 25-100 MG tablet 2 at 8am/2 at 11/ 2 at 2pm/ 2 at 4pm 07/19/24   Tat, Asberry S, DO  clobetasol  ointment (TEMOVATE ) 0.05 % APPLY TWICE DAILY -CAN USE UPTO SEVEN DAYS 04/02/24   Cleotilde Ronal RAMAN, MD  dicyclomine   (BENTYL ) 20 MG tablet Take 20 mg by mouth every 6 (six) hours. Takes as needed    [provider]  Docusate Calcium (STOOL SOFTENER PO) Take 1 tablet by mouth daily.    [provider]  escitalopram  (LEXAPRO ) 10 MG tablet Take 1 tablet (10 mg total) by mouth daily. 01/25/24   Tat, Asberry RAMAN, DO  FIBER PO Take by mouth 2 (two) times daily.    [provider]  fluticasone (FLONASE) 50 MCG/ACT nasal spray Place 2 sprays into the nose as needed.    [provider]  gabapentin  (NEURONTIN ) 600 MG tablet TAKE ONE TABLET AT BEDTIME 08/21/24   Tat, Asberry RAMAN, DO  Levodopa  (INBRIJA ) 42 MG CAPS You can inhale the capsules as needed up to 5 times per day, separated by 2 hour intervals. 09/02/24   Tat, Asberry RAMAN, DO  levothyroxine (SYNTHROID, LEVOTHROID) 75 MCG tablet Take 1 tablet by mouth every other day. 09/06/18   [provider]  levothyroxine (SYNTHROID, LEVOTHROID) 88 MCG tablet Take 88 mcg by mouth every other day.  08/23/17   [provider]  loratadine (CLARITIN) 10 MG tablet Take 10 mg by mouth daily as needed for allergies.    [provider]  Magnesium 400 MG CAPS Take 1 capsule by mouth daily. With food    [provider]  meclizine (ANTIVERT) 12.5 MG tablet Take 12.5 mg by mouth 3 (three) times daily as needed for dizziness.    [provider]  meloxicam (MOBIC) 15 MG tablet Take 15 mg by mouth daily.    [provider]  pregabalin (LYRICA) 75 MG capsule Take 75 mg by mouth 2 (two) times daily. 07/23/24   [provider]  Probiotic Product (ALIGN) 4 MG CAPS Take 1 tablet by mouth daily.    [provider]  valACYclovir  (VALTREX ) 500 MG tablet Take 1 tablet (500 mg total) by mouth 2 (two) times daily. Take one tablet twice daily at onset of symptoms for 3 days. 06/20/22   Cleotilde Ronal RAMAN, MD  Vitamin D, Ergocalciferol, (DRISDOL) 1.25 MG (50000 UNIT) CAPS capsule Take 50,000 Units by mouth once a week.     [provider]    Family History    Family History  Problem Relation Age of Onset   Breast cancer Mother    Bone cancer Mother    Diabetes Mother    Heart attack Father    Parkinson's disease Father    Alzheimer's disease Father    CAD Brother    Hypertension Brother    Breast cancer Maternal Grandmother    Breast cancer Maternal Aunt    Breast cancer Paternal Aunt    Cancer - Colon Paternal Uncle  Colon cancer Neg Hx    She indicated that her mother is deceased. She indicated that her father is deceased. She indicated that her brother is alive. She indicated that the status of her maternal grandmother is unknown. She indicated that the status of her maternal aunt is unknown. She indicated that the status of her paternal aunt is unknown. She indicated that the status of her paternal uncle is unknown. She indicated that the status of her neg hx is unknown.  Social History    Social History   Socioeconomic History   Marital status: Married    Spouse name: Optometrist   Number of children: 0   Years of education: Not on file   Highest education level: Not on file  Occupational History   Occupation: retired Runner, broadcasting/film/video  Tobacco Use   Smoking status: Never   Smokeless tobacco: Never  Vaping Use   Vaping status: Never Used  Substance and Sexual Activity   Alcohol use: Not Currently   Drug use: No   Sexual activity: Not Currently    Partners: Male    Birth control/protection: Post-menopausal  Other Topics Concern   Not on file  Social History Narrative   Right Handed   Two Story home - master located downstairs but patient sleeps upstairs at night    Lives with   Social Drivers of Health   Financial Resource Strain: Not on file  Food Insecurity: Not on file  Transportation Needs: Not on file  Physical Activity: Not on file  Stress: Not on file  Social Connections: Not on file  Intimate Partner Violence: Not on file     Review of Systems    General:  No  chills, fever, night sweats or weight changes.  Cardiovascular:  No chest pain, dyspnea on exertion, edema, orthopnea, palpitations, paroxysmal nocturnal dyspnea. Dermatological: No rash, lesions/masses Respiratory: No cough, dyspnea Urologic: No hematuria, dysuria Abdominal:   No nausea, vomiting, diarrhea, bright red blood per rectum, melena, or hematemesis Neurologic:  No visual changes, wkns, changes in mental status. All other systems reviewed and are otherwise negative except as noted above.  Physical Exam    VS:  BP 122/76 (BP Location: Left Arm, Patient Position: Sitting, Cuff Size: Normal)   Pulse 66   Resp 16   Ht 5' 3 (1.6 m)   Wt 182 lb 9.6 oz (82.8 kg)   LMP 12/13/1995   SpO2 96%   BMI 32.35 kg/m  , BMI Body mass index is 32.35 kg/m. GEN: Well nourished, well developed, in no acute distress. HEENT: normal. Neck: Supple, no JVD, carotid bruits, or masses. Cardiac: RRR, no murmurs, rubs, or gallops. No clubbing, cyanosis, edema.  Radials/DP/PT 2+ and equal bilaterally.  Respiratory:  Respirations regular and unlabored, clear to auscultation bilaterally. GI: Soft, nontender, nondistended, BS + x 4. MS: no deformity or atrophy. Skin: warm and dry, no rash. Neuro:  Strength and sensation are intact. Psych: Normal affect.  Accessory Clinical Findings    Recent Labs: No results found for requested labs within last 365 days.   Recent Lipid Panel No results found for: CHOL, TRIG, HDL, CHOLHDL, VLDL, LDLCALC, LDLDIRECT       ECG personally reviewed by me today- EKG Interpretation Date/Time:  Tuesday September 10 2024 13:46:48 EDT Ventricular Rate:  68 PR Interval:  192 QRS Duration:  70 QT Interval:  390 QTC Calculation: 414 R Axis:   4  Text Interpretation: Normal sinus rhythm Normal ECG No previous ECGs available Confirmed by  Emelia Hazy [phone]) on 09/10/2024 1:49:07 PM    Coronary calcium scoring 01/06/2023   FINDINGS: CORONARY CALCIUM  SCORES:   Left Main: 0   LAD: 39   LCx: 16.4   RCA: 12.3   Total Agatston Score: 67.7   MESA database percentile: 44   AORTA MEASUREMENTS:   Ascending Aorta: 32 cm   Descending Aorta:26 cm   OTHER FINDINGS:   Heart is normal size. Aorta normal caliber. No adenopathy. No confluent opacities or effusions. No acute findings in the upper abdomen. Bilateral breast implants partially visualized. Chest wall soft tissues are unremarkable. No acute bony abnormality.   IMPRESSION: Total Agatston score: 67.7   [place] database percentile: 44   No acute or significant extracardiac abnormality.     Electronically Signed   By: Franky Crease M.D.   On: 01/09/2023 09:24    Assessment & Plan   1.  DOE, fatigue-Notes increasing fatigue and activity intolerance over the last several months.  Reports dyspnea on exertion.  Limited in her physical activity due to Parkinson's and DOE. Order echocardiogram Heart healthy low-sodium diet Order CBC, BMP  Coronary artery disease, HLD-total Agatston score 67.7 on 1/24 High-fiber diet Continue fiber supplement Increase physical activity as tolerated Follows with PCP  Dizziness-stable.  Appears to be a product of Parkinson's disease. Allow time before ambulation/movement Lower extremity support stockings as tolerated Maintain p.o. hydration Continue meclizine  Parkinson's disease-reports compliance with Sinemet . Follows with neurology, internal medicine Continue current medical therapy Increase physical activity as tolerated  Anxiety/depression-mood stable. Increase physical activity as tolerated Continue Lexapro  Follows with PCP  Disposition: Follow-up with Dr. Kate or me in 2 months.   Hazy HERO. Gita Dilger NP-C     09/10/2024, 3:39 PM [facility] [address] [place], [place] [address] Office [phone]    Notice: This dictation was prepared with Dragon dictation along with  smaller phrase technology. Any transcriptional errors that result from this process are unintentional and may not be corrected upon review.   I spent 14 minutes examining this patient, reviewing medications, and using patient centered shared decision making involving their cardiac care.   I spent  20 minutes reviewing past medical history,  medications, and prior cardiac tests.

## 2024-09-10 ENCOUNTER — Encounter: Payer: Self-pay | Admitting: General Practice

## 2024-09-10 ENCOUNTER — Ambulatory Visit: Attending: General Practice | Admitting: General Practice

## 2024-09-10 VITALS — BP 122/76 | HR 66 | Resp 16 | Ht 63.0 in | Wt 182.6 lb

## 2024-09-10 DIAGNOSIS — G20A1 Parkinson's disease without dyskinesia, without mention of fluctuations: Secondary | ICD-10-CM | POA: Diagnosis not present

## 2024-09-10 DIAGNOSIS — F3289 Other specified depressive episodes: Secondary | ICD-10-CM

## 2024-09-10 DIAGNOSIS — R0609 Other forms of dyspnea: Secondary | ICD-10-CM

## 2024-09-10 DIAGNOSIS — R42 Dizziness and giddiness: Secondary | ICD-10-CM | POA: Diagnosis not present

## 2024-09-10 DIAGNOSIS — R5383 Other fatigue: Secondary | ICD-10-CM | POA: Diagnosis not present

## 2024-09-10 DIAGNOSIS — F419 Anxiety disorder, unspecified: Secondary | ICD-10-CM | POA: Diagnosis not present

## 2024-09-10 NOTE — Patient Instructions (Signed)
 Medication Instructions:   Your physician recommends that you continue on your current medications as directed. Please refer to the Current Medication list given to you today.   *If you need a refill on your cardiac medications before your next appointment, please call your pharmacy*   Lab Work:    PLEASE GO DOWN STAIRS  [facility]  FIRST FLOOR   ( GET OFF ELEVATORS WALK TOWARDS WAITING AREA LAB LOCATED BY PHARMACY):   BMET AND CBC  TODAY     If you have labs (blood work) drawn today and your tests are completely normal, you will receive your results only by: MyChart Message (if you have MyChart) OR A paper copy in the mail If you have any lab test that is abnormal or we need to change your treatment, we will call you to review the results.   Testing/Procedures:  NONE ORDERED  TODAY     Follow-Up: At [facility], you and your health needs are our priority.  As part of our continuing mission to provide you with exceptional heart care, our providers are all part of one team.  This team includes your primary Cardiologist (physician) and Advanced Practice Providers or APPs (Physician Assistants and Nurse Practitioners) who all work together to provide you with the care you need, when you need it.  Your next appointment:   1 -2 month(s)   Provider:  Josefa Beauvais  NP  We recommend signing up for the patient portal called MyChart.  Sign up information is provided on this After Visit Summary.  MyChart is used to connect with patients for Virtual Visits (Telemedicine).  Patients are able to view lab/test results, encounter notes, upcoming appointments, etc.  Non-urgent messages can be sent to your provider as well.   To learn more about what you can do with MyChart, go to [email or web address].   Other Instructions

## 2024-09-11 LAB — CBC
Hematocrit: 41.1 % (ref 34.0–46.6)
Hemoglobin: 13.7 g/dL (ref 11.1–15.9)
MCH: 32.3 pg (ref 26.6–33.0)
MCHC: 33.3 g/dL (ref 31.5–35.7)
MCV: 97 fL (ref 79–97)
Platelets: 292 x10E3/uL (ref 150–450)
RBC: 4.24 x10E6/uL (ref 3.77–5.28)
RDW: 12.5 % (ref 11.7–15.4)
WBC: 6 x10E3/uL (ref 3.4–10.8)

## 2024-09-11 LAB — BASIC METABOLIC PANEL WITH GFR
BUN/Creatinine Ratio: 14 (ref 12–28)
BUN: 11 mg/dL (ref 8–27)
CO2: 20 mmol/L (ref 20–29)
Calcium: 9.2 mg/dL (ref 8.7–10.3)
Chloride: 101 mmol/L (ref 96–106)
Creatinine, Ser: 0.79 mg/dL (ref 0.57–1.00)
Glucose: 91 mg/dL (ref 70–99)
Potassium: 4.2 mmol/L (ref 3.5–5.2)
Sodium: 139 mmol/L (ref 134–144)
eGFR: 76 mL/min/1.73 (ref >59)

## 2024-09-12 ENCOUNTER — Ambulatory Visit: Payer: Self-pay | Admitting: General Practice

## 2024-09-12 DIAGNOSIS — M5412 Radiculopathy, cervical region: Secondary | ICD-10-CM | POA: Diagnosis not present

## 2024-09-12 NOTE — Progress Notes (Signed)
 Pt has been made aware of normal result and verbalized understanding.  jw

## 2024-09-13 ENCOUNTER — Ambulatory Visit (HOSPITAL_COMMUNITY)
Admission: RE | Admit: 2024-09-13 | Discharge: 2024-09-13 | Disposition: A | Discharge status: Home / Self Care | Source: Ambulatory Visit | Attending: General Practice | Admitting: General Practice

## 2024-09-13 DIAGNOSIS — R5383 Other fatigue: Secondary | ICD-10-CM | POA: Insufficient documentation

## 2024-09-13 DIAGNOSIS — R0609 Other forms of dyspnea: Secondary | ICD-10-CM | POA: Diagnosis not present

## 2024-09-13 LAB — ECHOCARDIOGRAM COMPLETE
AR max vel: 1.07 cm2
AV Area VTI: 1.14 cm2
AV Area mean vel: 1.06 cm2
AV Mean grad: 4 mmHg
AV Peak grad: 7.8 mmHg
Ao pk vel: 1.4 m/s
Area-P 1/2: 5.54 cm2
MV M vel: 1.98 m/s
MV Peak grad: 15.7 mmHg
S' Lateral: 2.96 cm

## 2024-10-02 DIAGNOSIS — M5412 Radiculopathy, cervical region: Secondary | ICD-10-CM | POA: Diagnosis not present

## 2024-10-08 NOTE — Progress Notes (Unsigned)
 Cardiology Clinic Note   Patient Name: Ashley Henderson Date of Encounter: 10/10/2024  Primary Care Provider:  Dwight Trula SQUIBB, MD Primary Cardiologist:  None  Patient Profile    Ashley Henderson 83 year old female presents to the clinic today for follow-up evaluation of her dyspnea and to review her echocardiogram.  Past Medical History    Past Medical History:  Diagnosis Date   Abnormal uterine bleeding (AUB)    endo biopsy negative   ADD (attention deficit disorder)    Allergic rhinitis    Anxiety    Bouchard nodes (DJD hand)    Claustrophobia    Colon polyp    Cystic acne    Depression    Diverticulosis    Glaucoma    Goiter    Hematuria 1994   negative cysto, IVP   Hemorrhoids    Hyperlipidemia    Hyperplastic colon polyp    Hypothyroidism    IBS (irritable bowel syndrome)    using align-saw Dr Obie in 2014   Obesity    Parkinson disease (HCC)    Plantar fasciitis    Restless leg syndrome    Vertigo    Past Surgical History:  Procedure Laterality Date   breast implants replaced  6/08   GLAUCOMA SURGERY     bilateral laser   SHOULDER SURGERY  3/03, 9/01   bilateral   SIMPLE MASTECTOMY  1984   bilateral with implants (prophylactic mastectomy)    Allergies  Allergies  Allergen Reactions   Belviq [Lorcaserin] Other (See Comments)    drowsiness   Other     antihistamines   Biaxin [Clarithromycin] Rash   Sulfa Antibiotics Rash    History of Present Illness    Ashley Henderson has a PMH of IBS, rectal bleeding, hypothyroidism, Parkinson's, depression, insomnia, and family history of breast cancer.  Underwent stress testing 2008 which showed no ischemia.  Coronary calcium scoring 01/06/2023 with left main 0, LAD 39, circumflex 16.4, RCA 12.3 and total Agatston score of 67.7.  She was seen and evaluated on 06/19/2024 by her PCP.  She reported dyspnea on exertion and fatigue.  She reported that she previously was exercising on her treadmill.  However  lately she was not able to due to fatigue and dyspnea.  She was instructed to follow-up with cardiology.  During appointment visit she also noted weakness and shaky episodes.  The episodes have been present for 1 year.  She noted that some days were better and some days were worse.  She denied palpitations.  Her Parkinson's was managed by Dr. Evonnie.  She was continued on Sinemet .  She denied chest pain.  She denied GI issues.  She did note occasional dizzy episodes.  She did note that she was going to boxing classes for her Parkinson's.  She noted that she was not able to do much during these episodes/specimens.  She reported taking Lexapro  as prescribed.  She presented to the clinic 09/10/24 for cardiology evaluation.  She stated she had been exercising with a trainer 2 times per week.  She noted increased fatigue for several months.  She had been taking increased carbidopa  levodopa  due to shakiness from her Parkinson's.  This had made a difference.  She denied weight gain.  She noted that she had been dizzy for quite some time.  We reviewed her referral notes.  She expressed understanding.  We also reviewed her previous coronary calcium scoring.  I reviewed options for further testing and we  used shared decision making to decide to proceed with echocardiogram, CBC and BMP.  Her CBC and BMP were within normal limits.  Her echocardiogram 09/13/2024 showed an LVEF of 60-65%, trivial mitral valve regurgitation, moderate mitral annular calcification, trivial pulmonic valve regurgitation and no other valvular abnormalities.  She presents to the clinic today for follow-up evaluation states she has not had any improvement with her symptoms.  We reviewed her echocardiogram and lab work.  I recommend that she continue to increase her physical activity as tolerated.  I reassured her that her symptoms do not appear to be cardiac related.  We will have her follow-up as needed.  Today she denies chest pain, shortness of  breath, lower extremity edema,  palpitations, melena, hematuria, hemoptysis, diaphoresis, weakness, presyncope, syncope, orthopnea, and PND.      Home Medications    Prior to Admission medications   Medication Sig Start Date End Date Taking? Authorizing Provider  carbidopa -levodopa  (SINEMET  CR) 50-200 MG tablet Take 1 tablet by mouth at bedtime. 04/04/24   Tat, Asberry RAMAN, DO  carbidopa -levodopa  (SINEMET  IR) 25-100 MG tablet 2 at 8am/2 at 11/ 2 at 2pm/ 2 at 4pm 07/19/24   Tat, Asberry S, DO  clobetasol  ointment (TEMOVATE ) 0.05 % APPLY TWICE DAILY -CAN USE UPTO SEVEN DAYS 04/02/24   Cleotilde Ronal RAMAN, MD  dicyclomine  (BENTYL ) 20 MG tablet Take 20 mg by mouth every 6 (six) hours. Takes as needed    [provider]  Docusate Calcium (STOOL SOFTENER PO) Take 1 tablet by mouth daily.    [provider]  escitalopram  (LEXAPRO ) 10 MG tablet Take 1 tablet (10 mg total) by mouth daily. 01/25/24   Tat, Asberry RAMAN, DO  FIBER PO Take by mouth 2 (two) times daily.    [provider]  fluticasone (FLONASE) 50 MCG/ACT nasal spray Place 2 sprays into the nose as needed.    [provider]  gabapentin  (NEURONTIN ) 600 MG tablet TAKE ONE TABLET AT BEDTIME 08/21/24   Tat, Asberry RAMAN, DO  Levodopa  (INBRIJA ) 42 MG CAPS You can inhale the capsules as needed up to 5 times per day, separated by 2 hour intervals. 09/02/24   Tat, Asberry RAMAN, DO  levothyroxine (SYNTHROID, LEVOTHROID) 75 MCG tablet Take 1 tablet by mouth every other day. 09/06/18   [provider]  levothyroxine (SYNTHROID, LEVOTHROID) 88 MCG tablet Take 88 mcg by mouth every other day.  08/23/17   [provider]  loratadine (CLARITIN) 10 MG tablet Take 10 mg by mouth daily as needed for allergies.    [provider]  Magnesium 400 MG CAPS Take 1 capsule by mouth daily. With food    [provider]  meclizine (ANTIVERT) 12.5 MG tablet Take 12.5 mg by mouth 3 (three) times daily as needed for  dizziness.    [provider]  meloxicam (MOBIC) 15 MG tablet Take 15 mg by mouth daily.    [provider]  pregabalin (LYRICA) 75 MG capsule Take 75 mg by mouth 2 (two) times daily. 07/23/24   [provider]  Probiotic Product (ALIGN) 4 MG CAPS Take 1 tablet by mouth daily.    [provider]  valACYclovir  (VALTREX ) 500 MG tablet Take 1 tablet (500 mg total) by mouth 2 (two) times daily. Take one tablet twice daily at onset of symptoms for 3 days. 06/20/22   Cleotilde Ronal RAMAN, MD  Vitamin D, Ergocalciferol, (DRISDOL) 1.25 MG (50000 UNIT) CAPS capsule Take 50,000 Units by mouth once  a week.    [provider]    Family History    Family History  Problem Relation Age of Onset   Breast cancer Mother    Bone cancer Mother    Diabetes Mother    Heart attack Father    Parkinson's disease Father    Alzheimer's disease Father    CAD Brother    Hypertension Brother    Breast cancer Maternal Grandmother    Breast cancer Maternal Aunt    Breast cancer Paternal Aunt    Cancer - Colon Paternal Uncle    Colon cancer Neg Hx    She indicated that her mother is deceased. She indicated that her father is deceased. She indicated that her brother is alive. She indicated that the status of her maternal grandmother is unknown. She indicated that the status of her maternal aunt is unknown. She indicated that the status of her paternal aunt is unknown. She indicated that the status of her paternal uncle is unknown. She indicated that the status of her neg hx is unknown.  Social History    Social History   Socioeconomic History   Marital status: Married    Spouse name: Optometrist   Number of children: 0   Years of education: Not on file   Highest education level: Not on file  Occupational History   Occupation: retired runner, broadcasting/film/video  Tobacco Use   Smoking status: Never   Smokeless tobacco: Never  Vaping Use   Vaping status: Never Used  Substance and Sexual Activity    Alcohol use: Not Currently   Drug use: No   Sexual activity: Not Currently    Partners: Male    Birth control/protection: Post-menopausal  Other Topics Concern   Not on file  Social History Narrative   Right Handed   Two Story home - master located downstairs but patient sleeps upstairs at night    Lives with   Social Drivers of Health   Financial Resource Strain: Not on file  Food Insecurity: Not on file  Transportation Needs: Not on file  Physical Activity: Not on file  Stress: Not on file  Social Connections: Not on file  Intimate Partner Violence: Not on file     Review of Systems    General:  No chills, fever, night sweats or weight changes.  Cardiovascular:  No chest pain, dyspnea on exertion, edema, orthopnea, palpitations, paroxysmal nocturnal dyspnea. Dermatological: No rash, lesions/masses Respiratory: No cough, dyspnea Urologic: No hematuria, dysuria Abdominal:   No nausea, vomiting, diarrhea, bright red blood per rectum, melena, or hematemesis Neurologic:  No visual changes, wkns, changes in mental status. All other systems reviewed and are otherwise negative except as noted above.  Physical Exam    VS:  BP 122/64   Pulse 76   Ht 5' 3 (1.6 m)   Wt 185 lb 3.2 oz (84 kg)   LMP 12/13/1995   SpO2 98%   BMI 32.81 kg/m  , BMI Body mass index is 32.81 kg/m. GEN: Well nourished, well developed, in no acute distress. HEENT: normal. Neck: Supple, no JVD, carotid bruits, or masses. Cardiac: RRR, no murmurs, rubs, or gallops. No clubbing, cyanosis, edema.  Radials/DP/PT 2+ and equal bilaterally.  Respiratory:  Respirations regular and unlabored, clear to auscultation bilaterally. GI: Soft, nontender, nondistended, BS + x 4. MS: no deformity or atrophy. Skin: warm and dry, no rash. Neuro:  Strength and sensation are intact. Psych: Normal affect.  Accessory Clinical Findings    Recent Labs:  09/10/2024: BUN 11; Creatinine, Ser 0.79; Hemoglobin 13.7;  Platelets 292; Potassium 4.2; Sodium 139   Recent Lipid Panel No results found for: CHOL, TRIG, HDL, CHOLHDL, VLDL, LDLCALC, LDLDIRECT       ECG personally reviewed by me today-      Coronary calcium scoring 01/06/2023   FINDINGS: CORONARY CALCIUM SCORES:   Left Main: 0   LAD: 39   LCx: 16.4   RCA: 12.3   Total Agatston Score: 67.7   MESA database percentile: 44   AORTA MEASUREMENTS:   Ascending Aorta: 32 cm   Descending Aorta:26 cm   OTHER FINDINGS:   Heart is normal size. Aorta normal caliber. No adenopathy. No confluent opacities or effusions. No acute findings in the upper abdomen. Bilateral breast implants partially visualized. Chest wall soft tissues are unremarkable. No acute bony abnormality.   IMPRESSION: Total Agatston score: 67.7   [place] database percentile: 44   No acute or significant extracardiac abnormality.     Electronically Signed   By: Franky Crease M.D.   On: 01/09/2023 09:24  Echocardiogram 09/13/2024  IMPRESSIONS     1. Left ventricular ejection fraction, by estimation, is 60 to 65%. The  left ventricle has normal function. The left ventricle has no regional  wall motion abnormalities. Left ventricular diastolic function could not  be evaluated.   2. Right ventricular systolic function is normal. The right ventricular  size is normal. There is normal pulmonary artery systolic pressure.   3. The mitral valve is normal in structure. Trivial mitral valve  regurgitation. No evidence of mitral stenosis. Moderate mitral annular  calcification.   4. The aortic valve is normal in structure. Aortic valve regurgitation is  not visualized. No aortic stenosis is present.   5. The inferior vena cava is normal in size with greater than 50%  respiratory variability, suggesting right atrial pressure of 3 mmHg.   Comparison(s): No prior Echocardiogram.   FINDINGS   Left Ventricle: Left ventricular ejection fraction, by  estimation, is 60  to 65%. The left ventricle has normal function. The left ventricle has no  regional wall motion abnormalities. The left ventricular internal cavity  size was normal in size. There is   no left ventricular hypertrophy. Left ventricular diastolic function  could not be evaluated due to mitral annular calcification (moderate or  greater). Left ventricular diastolic function could not be evaluated.   Right Ventricle: The right ventricular size is normal. No increase in  right ventricular wall thickness. Right ventricular systolic function is  normal. There is normal pulmonary artery systolic pressure. The tricuspid  regurgitant velocity is 1.66 m/s, and   with an assumed right atrial pressure of 3 mmHg, the estimated right  ventricular systolic pressure is 14.0 mmHg.   Left Atrium: Left atrial size was normal in size.   Right Atrium: Right atrial size was normal in size.   Pericardium: There is no evidence of pericardial effusion.   Mitral Valve: The mitral valve is normal in structure. Moderate mitral  annular calcification. Trivial mitral valve regurgitation. No evidence of  mitral valve stenosis.   Tricuspid Valve: The tricuspid valve is normal in structure. Tricuspid  valve regurgitation is trivial. No evidence of tricuspid stenosis.   Aortic Valve: The aortic valve is normal in structure. Aortic valve  regurgitation is not visualized. No aortic stenosis is present. Aortic  valve mean gradient measures 4.0 mmHg. Aortic valve peak gradient measures  7.8 mmHg. Aortic valve area, by VTI  measures 1.14  cm.   Pulmonic Valve: The pulmonic valve was normal in structure. Pulmonic valve  regurgitation is trivial. No evidence of pulmonic stenosis.   Aorta: The aortic root and ascending aorta are structurally normal, with  no evidence of dilitation.   Venous: The inferior vena cava is normal in size with greater than 50%  respiratory variability, suggesting right  atrial pressure of 3 mmHg.   IAS/Shunts: No atrial level shunt detected by color flow Doppler.    Assessment & Plan   1.  DOE, fatigue-she continues to note  increasing fatigue and activity intolerance over the last several months.  She continues to report dyspnea on exertion.  Limited in her physical activity due to Parkinson's and DOE.  Echocardiogram reassuring with normal LVEF and no significant valvular abnormalities.  Details above.  Lab work reassuring. Continue with current physical activity Heart healthy low-sodium diet Patient reassured  Coronary artery disease, HLD-total Agatston score 67.7 on 1/24.  No chest pain today.  Denies exertional chest discomfort. High-fiber diet Continue fiber supplement Increase physical activity as tolerated Follows with PCP  Dizziness-unchanged.  [place], appears to be a product of Parkinson's disease. Allow time before ambulation/movement Lower extremity support stockings as tolerated Maintain p.o. hydration-reviewed Continue meclizine  Parkinson's disease-stable. Continue Sinemet  Follows with neurology, internal medicine Increase physical activity as tolerated   Disposition: Follow-up with Dr. Kate or me prn.   Josefa HERO. Brittanni Cariker NP-C     10/10/2024, 2:04 PM [facility] [address] [place], [place] [address] Office [phone]    Notice: This dictation was prepared with Dragon dictation along with smaller phrase technology. Any transcriptional errors that result from this process are unintentional and may not be corrected upon review.   I spent 14 minutes examining this patient, reviewing medications, and using patient centered shared decision making involving their cardiac care.   I spent  20 minutes reviewing past medical history,  medications, and prior cardiac tests.

## 2024-10-10 ENCOUNTER — Ambulatory Visit: Attending: Internal Medicine | Admitting: General Practice

## 2024-10-10 ENCOUNTER — Encounter: Payer: Self-pay | Admitting: General Practice

## 2024-10-10 VITALS — BP 122/64 | HR 76 | Ht 63.0 in | Wt 185.2 lb

## 2024-10-10 DIAGNOSIS — G20A1 Parkinson's disease without dyskinesia, without mention of fluctuations: Secondary | ICD-10-CM

## 2024-10-10 DIAGNOSIS — I2510 Atherosclerotic heart disease of native coronary artery without angina pectoris: Secondary | ICD-10-CM

## 2024-10-10 DIAGNOSIS — R5383 Other fatigue: Secondary | ICD-10-CM | POA: Diagnosis not present

## 2024-10-10 DIAGNOSIS — R42 Dizziness and giddiness: Secondary | ICD-10-CM | POA: Diagnosis not present

## 2024-10-10 DIAGNOSIS — R0609 Other forms of dyspnea: Secondary | ICD-10-CM

## 2024-10-10 NOTE — Patient Instructions (Signed)
 Medication Instructions:  Your physician recommends that you continue on your current medications as directed. Please refer to the Current Medication list given to you today.  *If you need a refill on your cardiac medications before your next appointment, please call your pharmacy*   Follow-Up: Follow up as needed  We recommend signing up for the patient portal called MyChart.  Sign up information is provided on this After Visit Summary.  MyChart is used to connect with patients for Virtual Visits (Telemedicine).  Patients are able to view lab/test results, encounter notes, upcoming appointments, etc.  Non-urgent messages can be sent to your provider as well.   To learn more about what you can do with MyChart, go to [email or web address].   Other Instructions Please increase your physical activity

## 2024-10-21 DIAGNOSIS — Z1231 Encounter for screening mammogram for malignant neoplasm of breast: Secondary | ICD-10-CM | POA: Diagnosis not present

## 2024-10-21 DIAGNOSIS — M8589 Other specified disorders of bone density and structure, multiple sites: Secondary | ICD-10-CM | POA: Diagnosis not present

## 2024-10-22 DIAGNOSIS — C44311 Basal cell carcinoma of skin of nose: Secondary | ICD-10-CM | POA: Diagnosis not present

## 2024-10-22 DIAGNOSIS — Z85828 Personal history of other malignant neoplasm of skin: Secondary | ICD-10-CM | POA: Diagnosis not present

## 2024-10-24 ENCOUNTER — Ambulatory Visit: Admitting: Neurology

## 2024-10-24 DIAGNOSIS — E039 Hypothyroidism, unspecified: Secondary | ICD-10-CM | POA: Diagnosis not present

## 2024-10-24 DIAGNOSIS — M5412 Radiculopathy, cervical region: Secondary | ICD-10-CM | POA: Diagnosis not present

## 2024-11-15 ENCOUNTER — Other Ambulatory Visit: Payer: Self-pay | Admitting: Neurology

## 2024-11-15 DIAGNOSIS — G20A1 Parkinson's disease without dyskinesia, without mention of fluctuations: Secondary | ICD-10-CM

## 2025-01-22 ENCOUNTER — Telehealth: Admitting: Neurology

## 2025-02-04 ENCOUNTER — Telehealth: Admitting: Neurology
